# Patient Record
Sex: Male | Born: 1970 | Race: Black or African American | Hispanic: No | Smoking: Never smoker
Health system: Southern US, Community
[De-identification: ages and names within clinical notes are randomized; demographics above are authoritative.]

## PROBLEM LIST (undated history)

## (undated) DIAGNOSIS — J189 Pneumonia, unspecified organism: Secondary | ICD-10-CM

## (undated) DIAGNOSIS — Z789 Other specified health status: Secondary | ICD-10-CM

## (undated) HISTORY — PX: NO PAST SURGERIES: SHX2092

---

## 2011-10-04 ENCOUNTER — Encounter (INDEPENDENT_AMBULATORY_CARE_PROVIDER_SITE_OTHER): Payer: Self-pay | Admitting: General Surgery

## 2011-10-05 ENCOUNTER — Ambulatory Visit (INDEPENDENT_AMBULATORY_CARE_PROVIDER_SITE_OTHER): Payer: BC Managed Care – PPO | Admitting: General Surgery

## 2011-10-05 ENCOUNTER — Encounter (INDEPENDENT_AMBULATORY_CARE_PROVIDER_SITE_OTHER): Payer: Self-pay | Admitting: General Surgery

## 2011-10-05 VITALS — BP 132/82 | HR 68 | Temp 97.1°F | Resp 16 | Ht 68.0 in | Wt 233.5 lb

## 2011-10-05 DIAGNOSIS — D171 Benign lipomatous neoplasm of skin and subcutaneous tissue of trunk: Secondary | ICD-10-CM

## 2011-10-05 DIAGNOSIS — D1779 Benign lipomatous neoplasm of other sites: Secondary | ICD-10-CM

## 2011-10-05 NOTE — Progress Notes (Signed)
Patient ID: Noah Roach, male   DOB: January 10, 1971, 40 y.o.   MRN: [id number]  Chief Complaint  Patient presents with  . New Evaluation    eval of lipoma on upper back     HPI Noah Roach is a 40 y.o. male.  Referred by Dr.  HPI This is a 40 year old male who is otherwise healthy presents with a several year history of enlarging back mass. This area is otherwise asymptomatic. He has no history of infection or any other complaints referable to this. It has gotten larger over this time. He would like to have this area excised now.  History reviewed. No pertinent past medical history.  History reviewed. No pertinent past surgical history.  History reviewed. No pertinent family history.  Social History History  Substance Use Topics  . Smoking status: Never Smoker   . Smokeless tobacco: Never Used  . Alcohol Use: No    No Known Allergies  No current outpatient prescriptions on file.    Review of Systems Review of Systems  Constitutional: Negative for fever, chills and unexpected weight change.  HENT: Negative for hearing loss, congestion, sore throat, trouble swallowing and voice change.   Eyes: Negative for visual disturbance.  Respiratory: Negative for cough and wheezing.   Cardiovascular: Negative for chest pain, palpitations and leg swelling.  Gastrointestinal: Negative for nausea, vomiting, abdominal pain, diarrhea, constipation, blood in stool, abdominal distention, anal bleeding and rectal pain.  Genitourinary: Negative for hematuria and difficulty urinating.  Musculoskeletal: Negative for arthralgias.  Skin: Negative for rash and wound.  Neurological: Negative for seizures, syncope, weakness and headaches.  Hematological: Negative for adenopathy. Does not bruise/bleed easily.  Psychiatric/Behavioral: Negative for confusion.    Blood pressure 132/82, pulse 68, temperature 97.1 F (36.2 C), temperature source Temporal, resp. rate 16, height 5\' 8"  (1.727 m),  weight 233 lb 8 oz (105.915 kg).  Physical Exam Physical Exam  Constitutional: He appears well-developed and well-nourished.  Cardiovascular: Normal rate, regular rhythm and normal heart sounds.   Pulmonary/Chest: Effort normal and breath sounds normal. No respiratory distress. He has no wheezes. He has no rales.        Assessment    Back mass    Plan   I told him I did think this was a lipoma. We discussed observation but I do think is large enough now that it probably should be excised. We discussed an excision of this area with possible drain placement postoperatively. We discussed the risks of bleeding and  infection. He would like to schedule this soon.        Danelly Hassinger 10/05/2011, 4:06 PM

## 2011-11-02 ENCOUNTER — Encounter (HOSPITAL_BASED_OUTPATIENT_CLINIC_OR_DEPARTMENT_OTHER): Payer: Self-pay | Admitting: *Deleted

## 2011-11-02 NOTE — Progress Notes (Signed)
No meds No surg In [place]-will come in 2 hr early dos for labs

## 2011-11-06 ENCOUNTER — Encounter (HOSPITAL_BASED_OUTPATIENT_CLINIC_OR_DEPARTMENT_OTHER): Payer: Self-pay | Admitting: Anesthesiology

## 2011-11-06 ENCOUNTER — Ambulatory Visit (HOSPITAL_BASED_OUTPATIENT_CLINIC_OR_DEPARTMENT_OTHER): Payer: BC Managed Care – PPO | Admitting: Anesthesiology

## 2011-11-06 ENCOUNTER — Ambulatory Visit (HOSPITAL_BASED_OUTPATIENT_CLINIC_OR_DEPARTMENT_OTHER)
Admission: RE | Admit: 2011-11-06 | Discharge: 2011-11-06 | Disposition: A | Discharge status: Home / Self Care | Payer: BC Managed Care – PPO | Source: Ambulatory Visit | Attending: General Surgery | Admitting: General Surgery

## 2011-11-06 ENCOUNTER — Other Ambulatory Visit (INDEPENDENT_AMBULATORY_CARE_PROVIDER_SITE_OTHER): Payer: Self-pay | Admitting: General Surgery

## 2011-11-06 ENCOUNTER — Encounter (HOSPITAL_BASED_OUTPATIENT_CLINIC_OR_DEPARTMENT_OTHER)
Admission: RE | Disposition: A | Discharge status: Home / Self Care | Payer: Self-pay | Source: Ambulatory Visit | Attending: General Surgery

## 2011-11-06 ENCOUNTER — Encounter (HOSPITAL_BASED_OUTPATIENT_CLINIC_OR_DEPARTMENT_OTHER): Payer: Self-pay

## 2011-11-06 DIAGNOSIS — D1739 Benign lipomatous neoplasm of skin and subcutaneous tissue of other sites: Secondary | ICD-10-CM

## 2011-11-06 DIAGNOSIS — D1779 Benign lipomatous neoplasm of other sites: Secondary | ICD-10-CM | POA: Insufficient documentation

## 2011-11-06 HISTORY — PX: LIPOMA EXCISION: SHX5283

## 2011-11-06 HISTORY — DX: Other specified health status: Z78.9

## 2011-11-06 SURGERY — EXCISION LIPOMA
Anesthesia: General | Site: Back | Wound class: Clean

## 2011-11-06 MED ORDER — FENTANYL CITRATE 0.05 MG/ML IJ SOLN
INTRAMUSCULAR | Status: DC | PRN
  Administered 2011-11-06: 100 ug via INTRAVENOUS

## 2011-11-06 MED ORDER — FENTANYL CITRATE 0.05 MG/ML IJ SOLN: 25.0000 ug | INTRAMUSCULAR | Status: DC | PRN

## 2011-11-06 MED ORDER — DEXAMETHASONE SODIUM PHOSPHATE 4 MG/ML IJ SOLN
INTRAMUSCULAR | Status: DC | PRN
  Administered 2011-11-06: 10 mg via INTRAVENOUS

## 2011-11-06 MED ORDER — PROPOFOL 10 MG/ML IV EMUL
INTRAVENOUS | Status: DC | PRN
  Administered 2011-11-06: 300 mg via INTRAVENOUS

## 2011-11-06 MED ORDER — LIDOCAINE HCL (CARDIAC) 20 MG/ML IV SOLN
INTRAVENOUS | Status: DC | PRN
  Administered 2011-11-06: 100 mg via INTRAVENOUS

## 2011-11-06 MED ORDER — MIDAZOLAM HCL 5 MG/5ML IJ SOLN
INTRAMUSCULAR | Status: DC | PRN
  Administered 2011-11-06: 2 mg via INTRAVENOUS

## 2011-11-06 MED ORDER — OXYCODONE-ACETAMINOPHEN 5-325 MG PO TABS: 1.0000 | ORAL_TABLET | ORAL | Status: DC | PRN

## 2011-11-06 MED ORDER — MORPHINE SULFATE 2 MG/ML IJ SOLN: 0.0500 mg/kg | INTRAMUSCULAR | Status: DC | PRN

## 2011-11-06 MED ORDER — MIDAZOLAM HCL 2 MG/2ML IJ SOLN: 0.5000 mg | INTRAMUSCULAR | Status: DC | PRN

## 2011-11-06 MED ORDER — DROPERIDOL 2.5 MG/ML IJ SOLN
INTRAMUSCULAR | Status: DC | PRN
  Administered 2011-11-06: 0.625 mg via INTRAVENOUS

## 2011-11-06 MED ORDER — LACTATED RINGERS IV SOLN
INTRAVENOUS | Status: DC
  Administered 2011-11-06 (×2): via INTRAVENOUS

## 2011-11-06 MED ORDER — METOCLOPRAMIDE HCL 5 MG/ML IJ SOLN: 10.0000 mg | Freq: Once | INTRAMUSCULAR | Status: DC | PRN

## 2011-11-06 MED ORDER — SUCCINYLCHOLINE CHLORIDE 20 MG/ML IJ SOLN
INTRAMUSCULAR | Status: DC | PRN
  Administered 2011-11-06: 160 mg via INTRAVENOUS

## 2011-11-06 MED ORDER — ACETAMINOPHEN 10 MG/ML IV SOLN
1000.0000 mg | Freq: Once | INTRAVENOUS | Status: AC
  Administered 2011-11-06: 1000 mg via INTRAVENOUS

## 2011-11-06 MED ORDER — CEFAZOLIN SODIUM-DEXTROSE 2-3 GM-% IV SOLR
2.0000 g | INTRAVENOUS | Status: AC
  Administered 2011-11-06: 2 g via INTRAVENOUS

## 2011-11-06 SURGICAL SUPPLY — 50 items
106365 IMPLANT
BLADE SURG 15 STRL LF DISP TIS (BLADE) ×1 IMPLANT
BLADE SURG 15 STRL SS (BLADE) ×1
BLADE SURG ROTATE 9660 (MISCELLANEOUS) IMPLANT
CANISTER SUCTION 1200CC (MISCELLANEOUS) ×2 IMPLANT
CHLORAPREP W/TINT 26ML (MISCELLANEOUS) IMPLANT
CLOTH BEACON ORANGE TIMEOUT ST (SAFETY) ×2 IMPLANT
COVER MAYO STAND STRL (DRAPES) ×2 IMPLANT
COVER TABLE BACK 60X90 (DRAPES) ×2 IMPLANT
DECANTER SPIKE VIAL GLASS SM (MISCELLANEOUS) IMPLANT
DERMABOND ADVANCED (GAUZE/BANDAGES/DRESSINGS)
DERMABOND ADVANCED .7 DNX12 (GAUZE/BANDAGES/DRESSINGS) IMPLANT
DRAIN CHANNEL 19F RND (DRAIN) ×2 IMPLANT
DRAPE PED LAPAROTOMY (DRAPES) ×2 IMPLANT
DRSG TEGADERM 4X4.75 (GAUZE/BANDAGES/DRESSINGS) IMPLANT
ELECT COATED BLADE 2.86 ST (ELECTRODE) ×2 IMPLANT
ELECT REM PT RETURN 9FT ADLT (ELECTROSURGICAL) ×2
ELECTRODE REM PT RTRN 9FT ADLT (ELECTROSURGICAL) ×1 IMPLANT
EVACUATOR SILICONE 100CC (DRAIN) ×2 IMPLANT
GAUZE PACKING IODOFORM 1/4X5 (PACKING) IMPLANT
GAUZE SPONGE 4X4 12PLY STRL LF (GAUZE/BANDAGES/DRESSINGS) IMPLANT
GLOVE BIO SURGEON STRL SZ7 (GLOVE) ×2 IMPLANT
GLOVE BIOGEL PI IND STRL 7.5 (GLOVE) ×1 IMPLANT
GLOVE BIOGEL PI INDICATOR 7.5 (GLOVE) ×1
GLOVE SKINSENSE NS SZ6.5 (GLOVE) ×2
GLOVE SKINSENSE STRL SZ6.5 (GLOVE) ×2 IMPLANT
GOWN PREVENTION PLUS XLARGE (GOWN DISPOSABLE) ×4 IMPLANT
NEEDLE HYPO 25X1 1.5 SAFETY (NEEDLE) ×2 IMPLANT
NS IRRIG 1000ML POUR BTL (IV SOLUTION) ×2 IMPLANT
PACK BASIN DAY SURGERY FS (CUSTOM PROCEDURE TRAY) ×2 IMPLANT
PENCIL BUTTON HOLSTER BLD 10FT (ELECTRODE) ×2 IMPLANT
SPONGE GAUZE 4X4 12PLY (GAUZE/BANDAGES/DRESSINGS) ×2 IMPLANT
STAPLER VISISTAT 35W (STAPLE) ×2 IMPLANT
SUT ETHILON 2 0 FS 18 (SUTURE) ×2 IMPLANT
SUT MNCRL AB 4-0 PS2 18 (SUTURE) ×2 IMPLANT
SUT SILK 2 0 SH (SUTURE) IMPLANT
SUT VIC AB 2-0 SH 27 (SUTURE)
SUT VIC AB 2-0 SH 27XBRD (SUTURE) IMPLANT
SUT VICRYL 3-0 CR8 SH (SUTURE) ×2 IMPLANT
SUT VICRYL 4-0 PS2 18IN ABS (SUTURE) IMPLANT
SWAB CULTURE LIQ STUART DBL (MISCELLANEOUS) IMPLANT
SYR CONTROL 10ML LL (SYRINGE) ×2 IMPLANT
TAPE CLOTH SURG 4X10 WHT LF (GAUZE/BANDAGES/DRESSINGS) ×2 IMPLANT
TOWEL OR 17X24 6PK STRL BLUE (TOWEL DISPOSABLE) ×2 IMPLANT
TOWEL OR NON WOVEN STRL DISP B (DISPOSABLE) ×2 IMPLANT
TUBE ANAEROBIC SPECIMEN COL (MISCELLANEOUS) IMPLANT
TUBE CONNECTING 20X1/4 (TUBING) ×2 IMPLANT
UNDERPAD 30X30 INCONTINENT (UNDERPADS AND DIAPERS) IMPLANT
WATER STERILE IRR 1000ML POUR (IV SOLUTION) IMPLANT
YANKAUER SUCT BULB TIP NO VENT (SUCTIONS) ×2 IMPLANT

## 2011-11-06 NOTE — Anesthesia Preprocedure Evaluation (Signed)
Anesthesia Evaluation  Patient identified by MRN, date of birth, ID band Patient awake    Reviewed: Allergy & Precautions, H&P , NPO status , Patient's Chart, lab work & pertinent test results, reviewed documented beta blocker date and time   Airway Mallampati: II TM Distance: >3 FB Neck ROM: full    Dental   Pulmonary neg pulmonary ROS,          Cardiovascular neg cardio ROS     Neuro/Psych Negative Neurological ROS  Negative Psych ROS   GI/Hepatic negative GI ROS, Neg liver ROS,   Endo/Other  Negative Endocrine ROSMorbid obesity  Renal/GU negative Renal ROS  Genitourinary negative   Musculoskeletal   Abdominal   Peds  Hematology negative hematology ROS (+)   Anesthesia Other Findings See surgeon's H&P   Reproductive/Obstetrics negative OB ROS                           Anesthesia Physical Anesthesia Plan  ASA: III  Anesthesia Plan: General   Post-op Pain Management:    Induction: Intravenous  Airway Management Planned: Oral ETT  Additional Equipment:   Intra-op Plan:   Post-operative Plan: Extubation in OR  Informed Consent: I have reviewed the patients History and Physical, chart, labs and discussed the procedure including the risks, benefits and alternatives for the proposed anesthesia with the patient or authorized representative who has indicated his/her understanding and acceptance.     Plan Discussed with: CRNA and Surgeon  Anesthesia Plan Comments:         Anesthesia Quick Evaluation

## 2011-11-06 NOTE — Anesthesia Postprocedure Evaluation (Signed)
  Anesthesia Post-op Note  Patient: Noah Roach  Procedure(s) Performed:  EXCISION LIPOMA - Excision of back mass  Patient Location: PACU  Anesthesia Type: General  Level of Consciousness: awake  Airway and Oxygen Therapy: Patient Spontanous Breathing and Patient connected to face mask oxygen  Post-op Pain: none  Post-op Assessment: Post-op Vital signs reviewed, Patient's Cardiovascular Status Stable, Respiratory Function Stable and Patent Airway  Post-op Vital Signs: Reviewed and stable  Complications: No apparent anesthesia complications

## 2011-11-06 NOTE — H&P (Signed)
Noah Roach is an 41 y.o. male.   Chief Complaint: back mass HPI:  40yom with enlarging left upper back mass who would like this excised for symptoms.  I saw him a month ago and he has had no changes since then.  Past Medical History  Diagnosis Date  . No pertinent past medical history     Past Surgical History  Procedure Date  . No past surgeries     History reviewed. No pertinent family history. Social History:  reports that he has never smoked. He has never used smokeless tobacco. He reports that he does not drink alcohol or use illicit drugs.  Allergies: No Known Allergies  Medications Prior to Admission  Medication Dose Route Frequency Provider Last Rate Last Dose  . acetaminophen (OFIRMEV) IV 1,000 mg  1,000 mg Intravenous Once Constance Goltz, MD   1,000 mg at 11/06/11 1249  . ceFAZolin (ANCEF) IVPB 2 g/50 mL premix  2 g Intravenous 60 min Pre-Op Emelia Loron, MD      . lactated ringers infusion   Intravenous Continuous Constance Goltz, MD 20 mL/hr at 11/06/11 1239    . midazolam (VERSED) injection 0.5-2 mg  0.5-2 mg Intravenous PRN Constance Goltz, MD       No current outpatient prescriptions on file as of 11/06/2011.    No results found for this or any previous visit (from the past 48 hour(s)). No results found.  ROS  Blood pressure 138/97, pulse 74, temperature 98 F (36.7 C), temperature source Oral, resp. rate 20, height 5\' 8"  (1.727 m), weight 230 lb (104.327 kg), SpO2 99.00%. Physical Exam  Physical Exam  Physical Exam  Constitutional: He appears well-developed and well-nourished.  Cardiovascular: Normal rate, regular rhythm and normal heart sounds.  Pulmonary/Chest: Effort normal and breath sounds normal. No respiratory distress. He has no wheezes. He has no rales.     Assessment/Plan Back mass   We discussed observation but I do think is large enough now that it probably should be excised. We discussed an excision  of this area with possible drain placement postoperatively. We discussed the risks of bleeding and infection. He would like to schedule this soon.  Noah Roach 11/06/2011, 1:22 PM

## 2011-11-06 NOTE — Op Note (Signed)
Preoperative diagnosis: Back mass consistent with lipoma Postoperative diagnoses: Same as above  Procedure: Excision of subfascial 10 x 10 cm mass consistent with lipoma Surgeon: Dr. Harden Mo Anesthesia: Gen. Drains: 23 French Blake drain to subfascial space Estimated blood loss: Minimal Complications: None Specimens: Mass to pathology Sponge needle count correct x2 at end of operation Disposition the patient recovery room in stable condition  Indications: This is a 41 year old male with a back mass that has been rapidly enlarging lately. This clinically looked like a lipoma. He wanted this area excised we discussed doing.  Procedure: After informed consent was obtained the patient was taken to the operating room. He was initially 1 g of intravenous cefazolin. Sequential compression devices were placed on his legs prior to induction. He was then placed under general endotracheal anesthesia without complication. He was then rolled to the prone position appropriately padded. His back was prepped and draped in the standard sterile surgical fashion. A surgical timeout was performed.  I made a 6 cm incision overlying the mass. I used cautery to carry this below the fascia. What appeared to be a lipoma was then excised in total all the way down to his musculature. This was then passed off the table as a specimen. Hemostasis was obtained. Irrigation was performed. I inserted a 20 [place] Blake drain into the space due to the size of the space. I then closed this in several layers of 3-0 Vicryl and stapled the skin. Bacitracin and a sterile dressing was placed. He tolerated this well was extubated and transferred to the recovery room in stable condition.

## 2011-11-06 NOTE — Anesthesia Procedure Notes (Signed)
Procedure Name: Intubation Date/Time: 11/06/2011 1:44 PM Performed by: Signa Kell Pre-anesthesia Checklist: Patient identified, Emergency Drugs available, Suction available and Patient being monitored Patient Re-evaluated:Patient Re-evaluated prior to inductionOxygen Delivery Method: Circle System Utilized Preoxygenation: Pre-oxygenation with 100% oxygen Intubation Type: IV induction Ventilation: Mask ventilation without difficulty Laryngoscope Size: Mac and 3 Tube type: Oral Number of attempts: 1 Airway Equipment and Method: stylet Placement Confirmation: ETT inserted through vocal cords under direct vision,  positive ETCO2 and breath sounds checked- equal and bilateral Secured at: 21 cm Tube secured with: Tape Dental Injury: Teeth and Oropharynx as per pre-operative assessment

## 2011-11-06 NOTE — Transfer of Care (Signed)
Immediate Anesthesia Transfer of Care Note  Patient: Noah Roach  Procedure(s) Performed:  EXCISION LIPOMA - Excision of back mass  Patient Location: PACU  Anesthesia Type: General  Level of Consciousness: awake and alert   Airway & Oxygen Therapy: Patient Spontanous Breathing and Patient connected to face mask oxygen  Post-op Assessment: Report given to PACU RN and Post -op Vital signs reviewed and stable  Post vital signs: Reviewed and stable Filed Vitals:   11/06/11 1235  BP: 138/97  Pulse: 74  Temp: 36.7 C  Resp: 20    Complications: No apparent anesthesia complications

## 2011-11-06 NOTE — Interval H&P Note (Signed)
History and Physical Interval Note:  11/06/2011 1:23 PM  Noah Roach  has presented today for surgery, with the diagnosis of back mass  The various methods of treatment have been discussed with the patient and family. After consideration of risks, benefits and other options for treatment, the patient has consented to  Procedure(s): EXCISION LIPOMA as a surgical intervention .  The patients' history has been reviewed, patient examined, no change in status, stable for surgery.  I have reviewed the patients' chart and labs.  Questions were answered to the patient's satisfaction.     Noah Roach

## 2011-11-07 ENCOUNTER — Encounter (HOSPITAL_BASED_OUTPATIENT_CLINIC_OR_DEPARTMENT_OTHER): Payer: Self-pay | Admitting: General Surgery

## 2011-11-08 ENCOUNTER — Telehealth (INDEPENDENT_AMBULATORY_CARE_PROVIDER_SITE_OTHER): Payer: Self-pay

## 2011-11-08 NOTE — Telephone Encounter (Signed)
LMOM notifying him of his path report and telling him his f/u appt with Dr Dwain Sarna on 11-14-11 @8 :45.

## 2011-11-13 ENCOUNTER — Telehealth (INDEPENDENT_AMBULATORY_CARE_PROVIDER_SITE_OTHER): Payer: Self-pay

## 2011-11-13 NOTE — Telephone Encounter (Signed)
Patient called in saying he thought his back incision was swollen and draining. He had a dressing over it but could not see it. I asked him what kind drainage was coming from the area and he couldn't tell me. He also could not tell me if it was red, hot to touch, or exactly if it was swollen or not. He said he could not take dressing off to look at area because of its location. No one is at home with him to help. He said someone will be there in a little while and he would call back.

## 2011-11-14 ENCOUNTER — Ambulatory Visit (INDEPENDENT_AMBULATORY_CARE_PROVIDER_SITE_OTHER): Payer: BC Managed Care – PPO | Admitting: General Surgery

## 2011-11-14 ENCOUNTER — Encounter (INDEPENDENT_AMBULATORY_CARE_PROVIDER_SITE_OTHER): Payer: Self-pay | Admitting: General Surgery

## 2011-11-14 VITALS — BP 122/86 | HR 75 | Temp 97.8°F | Ht 68.0 in | Wt 228.2 lb

## 2011-11-14 DIAGNOSIS — Z09 Encounter for follow-up examination after completed treatment for conditions other than malignant neoplasm: Secondary | ICD-10-CM

## 2011-11-14 MED ORDER — OXYCODONE-ACETAMINOPHEN 5-325 MG PO TABS: 1.0000 | ORAL_TABLET | ORAL | Status: AC | PRN

## 2011-11-14 NOTE — Patient Instructions (Signed)
Continue recording drain output.

## 2011-11-14 NOTE — Progress Notes (Signed)
Subjective:     Patient ID: Noah Roach, male   DOB: [birth year], 41 y.o.   MRN: [id number]  HPI This is a 41 year old male who is one week status post a very large upper back lipoma excision. I left a drain and at that time. His pathology showed a lipoma. He had some trouble with some bleeding around his drain and it sounds like his drain was clogged. He comes in today also complains of some swelling at the surgical site and in his shoulder. He otherwise denies any fevers or any other complaints.  Review of Systems     Objective:   Physical Exam Incision without infection, moderate edema, drain with serosang fluid in it and is clogged    Assessment:     S/p lipoma excision    Plan:     Discussed why drainage was occurring and will continue drain for another week I will remove staples in 2 weeks I asked him to call if drain gets clogged again and will have him return sooner

## 2011-11-15 ENCOUNTER — Telehealth (INDEPENDENT_AMBULATORY_CARE_PROVIDER_SITE_OTHER): Payer: Self-pay | Admitting: General Surgery

## 2011-11-15 ENCOUNTER — Ambulatory Visit (INDEPENDENT_AMBULATORY_CARE_PROVIDER_SITE_OTHER): Payer: Self-pay

## 2011-11-15 DIAGNOSIS — Z4800 Encounter for change or removal of nonsurgical wound dressing: Secondary | ICD-10-CM

## 2011-11-15 NOTE — Telephone Encounter (Signed)
Patient contacted the office regarding his drain tube,gave him the instruction on how to milk the tube, pt unable to get any drainage, pt was told to come in today to see a nurse for instuction.

## 2011-11-15 NOTE — Progress Notes (Signed)
Pt came in for nurse only to have his drain checked from excision of lipoma on back b/c he felt the drain was not working. I looked at the bulb which had less than 5cc's in the bulb and I milked the drain tube to see if anymore drainage was stuck in the tube. I was able to get less than 2cc's down the drain tube into the bulb. I explained that the drain is still in place and seems to be working fine. I think the pt is just getting less in the bulb now and he thinks something is wrong with drain. I recovered the drain and the incision with the staples. I advised pt to call me if he has anymore changes. The pt has f/u appt with Dr Dwain Sarna.

## 2011-11-20 ENCOUNTER — Encounter (INDEPENDENT_AMBULATORY_CARE_PROVIDER_SITE_OTHER): Payer: Self-pay | Admitting: General Surgery

## 2011-11-20 ENCOUNTER — Ambulatory Visit (INDEPENDENT_AMBULATORY_CARE_PROVIDER_SITE_OTHER): Payer: Self-pay | Admitting: General Surgery

## 2011-11-20 VITALS — BP 132/84 | HR 85 | Temp 98.1°F | Ht 68.0 in | Wt 228.6 lb

## 2011-11-20 DIAGNOSIS — Z09 Encounter for follow-up examination after completed treatment for conditions other than malignant neoplasm: Secondary | ICD-10-CM

## 2011-11-20 NOTE — Progress Notes (Signed)
Subjective:     Patient ID: Noah Roach, male   DOB: 09/21/1971, 41 y.o.   MRN: [id number]  HPI This is a 41 year old male I took a very large upper back lipoma off. He's had a drain in postoperatively. He had a hematoma which is now resolving. His drain is not really putting out anything at all. He has no real complaints today. He comes in today to discuss drain and staple removal.  Review of Systems     Objective:   Physical Exam Healing incision without infection, staples and drain in place    Assessment:     Excision of large back lipoma    Plan:     I removed his drain and his staples today Return to work Monday Return to see me as needed

## 2016-06-22 ENCOUNTER — Emergency Department (HOSPITAL_COMMUNITY): Payer: Self-pay

## 2016-06-22 ENCOUNTER — Encounter (HOSPITAL_COMMUNITY): Payer: Self-pay

## 2016-06-22 ENCOUNTER — Emergency Department (HOSPITAL_COMMUNITY)
Admission: EM | Admit: 2016-06-22 | Discharge: 2016-06-22 | Disposition: A | Discharge status: Home / Self Care | Payer: Self-pay | Attending: Emergency Medicine | Admitting: Emergency Medicine

## 2016-06-22 DIAGNOSIS — J189 Pneumonia, unspecified organism: Secondary | ICD-10-CM | POA: Insufficient documentation

## 2016-06-22 LAB — BASIC METABOLIC PANEL
ANION GAP: 10 (ref 5–15)
BUN: 10 mg/dL (ref 6–20)
CHLORIDE: 106 mmol/L (ref 101–111)
CO2: 21 mmol/L — ABNORMAL LOW (ref 22–32)
Calcium: 9.1 mg/dL (ref 8.9–10.3)
Creatinine, Ser: 1.08 mg/dL (ref 0.61–1.24)
GFR calc Af Amer: >60 mL/min (ref >60)
Glucose, Bld: 104 mg/dL — ABNORMAL HIGH (ref 65–99)
POTASSIUM: 3.8 mmol/L (ref 3.5–5.1)
SODIUM: 137 mmol/L (ref 135–145)

## 2016-06-22 LAB — CBC WITH DIFFERENTIAL/PLATELET
BASOS ABS: 0 10*3/uL (ref 0.0–0.1)
Basophils Relative: 0 %
EOS ABS: 0.2 10*3/uL (ref 0.0–0.7)
EOS PCT: 3 %
HCT: 41.5 % (ref 39.0–52.0)
HEMOGLOBIN: 13.3 g/dL (ref 13.0–17.0)
LYMPHS ABS: 1.3 10*3/uL (ref 0.7–4.0)
LYMPHS PCT: 23 %
MCH: 26.7 pg (ref 26.0–34.0)
MCHC: 32 g/dL (ref 30.0–36.0)
MCV: 83.2 fL (ref 78.0–100.0)
Monocytes Absolute: 1 10*3/uL (ref 0.1–1.0)
Monocytes Relative: 18 %
NEUTROS PCT: 56 %
Neutro Abs: 3.2 10*3/uL (ref 1.7–7.7)
PLATELETS: 215 10*3/uL (ref 150–400)
RBC: 4.99 MIL/uL (ref 4.22–5.81)
RDW: 13.8 % (ref 11.5–15.5)
WBC: 5.6 10*3/uL (ref 4.0–10.5)

## 2016-06-22 MED ORDER — LEVOFLOXACIN 750 MG PO TABS
750.0000 mg | ORAL_TABLET | Freq: Every day | ORAL | Status: DC
  Administered 2016-06-22: 750 mg via ORAL
  Filled 2016-06-22: qty 1

## 2016-06-22 MED ORDER — LEVOFLOXACIN 750 MG PO TABS: 750.0000 mg | ORAL_TABLET | Freq: Every day | ORAL | 0 refills | Status: DC

## 2016-06-22 NOTE — ED Notes (Signed)
This RN ambulated pt around the unit. The pt O2 sat stayed above 90% on RA and the pt HR remained around 95 bpm. Merry Proud, PA notified.

## 2016-06-22 NOTE — ED Notes (Signed)
Patient transported to X-ray 

## 2016-06-22 NOTE — Discharge Instructions (Signed)
Please take antibiotics as directed, please follow-up with infectious disease specialist for reevaluation further management. Please return the emergency room immediately if you experience any new or worsening signs or symptoms

## 2016-06-22 NOTE — ED Provider Notes (Signed)
[facility] DEPT Provider Note   CSN: [id number] Arrival date & time: 06/22/16  1145     History   Chief Complaint Chief Complaint  Patient presents with  . Shortness of Breath  . Back Pain    HPI Noah Roach is a 45 y.o. male.  HPI   64 show male presents today with complaints of cough and shortness of breath. Patient reports that at the end of July he was [place], and noted that he started having upper respiratory complaints including cough while on his way there. He reports that the cough continue persist, and followed up at a clinic. He notes that chest x-ray was done which showed an infection in his lungs. He was placed on antibiotics, and notes that symptoms improved. He states that on Tuesday after cleaning his bathroom he started to have a cough again, nonproductive with associated back pain. He reports that he feels extremely fatigued, and ambulation causes shortness of breath. He denies any chest pain, history of cardiac problems, history DVT or PE, y pulmonary history, lower extremity swelling or edema, or any PE DVT risk factors. Patient notes she's been using Mucinex at home with no relief the symptoms. Vision is a nonsmoker.  Past Medical History:  Diagnosis Date  . No pertinent past medical history     There are no active problems to display for this patient.   Past Surgical History:  Procedure Laterality Date  . LIPOMA EXCISION  11/06/2011   Procedure: EXCISION LIPOMA;  Surgeon: Rolm Bookbinder, MD;  Location: [facility];  Service: General;  Laterality: N/A;  Excision of back mass  . NO PAST SURGERIES        Home Medications    Prior to Admission medications   Medication Sig Start Date End Date Taking? Authorizing Provider  levofloxacin (LEVAQUIN) 750 MG tablet Take 1 tablet (750 mg total) by mouth daily. 06/22/16   Okey Regal, PA-C    Family History No family history on file.  Social History Social History    Substance Use Topics  . Smoking status: Never Smoker  . Smokeless tobacco: Never Used  . Alcohol use No     Allergies   Review of patient's allergies indicates no known allergies.   Review of Systems Review of Systems  All other systems reviewed and are negative.    Physical Exam Updated Vital Signs BP 136/99   Pulse 85   Temp 98.3 F (36.8 C) (Oral)   Resp 18   Ht 5\' 8"  (1.727 m)   Wt 106.1 kg   SpO2 96%   BMI 35.58 kg/m   Physical Exam  Constitutional: He is oriented to person, place, and time. He appears well-developed and well-nourished.  HENT:  Head: Normocephalic and atraumatic.  Eyes: Conjunctivae are normal. Pupils are equal, round, and reactive to light. Right eye exhibits no discharge. Left eye exhibits no discharge. No scleral icterus.  Neck: Normal range of motion. No JVD present. No tracheal deviation present.  Pulmonary/Chest: Effort normal. No stridor.  Crackles right lower lobe  Musculoskeletal: He exhibits no edema.  Neurological: He is alert and oriented to person, place, and time. Coordination normal.  Psychiatric: He has a normal mood and affect. His behavior is normal. Judgment and thought content normal.  Nursing note and vitals reviewed.    ED Treatments / Results  Labs (all labs ordered are listed, but only abnormal results are displayed) Labs Reviewed  BASIC METABOLIC PANEL - Abnormal; Notable for  the following:       Result Value   CO2 21 (*)    Glucose, Bld 104 (*)    All other components within normal limits  CBC WITH DIFFERENTIAL/PLATELET    EKG  EKG Interpretation  Date/Time:  Thursday June 22 2016 11:50:31 EDT Ventricular Rate:  103 PR Interval:  144 QRS Duration: 76 QT Interval:  336 QTC Calculation: 440 R Axis:   49 Text Interpretation:  Sinus tachycardia Otherwise normal ECG No old tracing to compare Confirmed by FLOYD MD, DANIEL [phone]) on 06/22/2016 12:29:33 PM       Radiology Dg Chest 2 View  Result  Date: 06/22/2016 CLINICAL DATA:  Cough and sob since July 22 was overseas and got worse in [place] was checked and giving meds and got even worse when he got back to the states On the 21st Of august EXAM: CHEST  2 VIEW COMPARISON:  None. FINDINGS: There is hazy bilateral airspace opacity in the lower lungs, left greater than right, partly silhouetting the posterior hemidiaphragms. Focal opacities noted along the minor fissure. There is hazy peripheral opacity in the left upper lobe with asymmetric pleural thickening at the left apex. There is no vascular congestion interstitial thickening to suggest pulmonary edema. No pleural effusion or pneumothorax. Cardiac silhouette is normal in size. No mediastinal or hilar masses or convincing adenopathy. Skeletal structures are unremarkable. IMPRESSION: 1. Findings are consistent with multifocal pneumonia. Recommend followup radiographs after treatment to document improvement/resolution. Electronically Signed   By: Lajean Manes M.D.   On: 06/22/2016 12:58    Procedures Procedures (including critical care time)  Medications Ordered in ED Medications  levofloxacin (LEVAQUIN) tablet 750 mg (750 mg Oral Given 06/22/16 1359)     Initial Impression / Assessment and Plan / ED Course  I have reviewed the triage vital signs and the nursing notes.  Pertinent labs & imaging results that were available during my care of the patient were reviewed by me and considered in my medical decision making (see chart for details).  Clinical Course    Final Clinical Impressions(s) / ED Diagnoses   Final diagnoses:  Community acquired pneumonia   Labs:CBC, BMP  Imaging: DG chest  Consults:  Therapeutics:  Discharge Meds:   Assessment/Plan:  71 show male presents today with multifocal pneumonia. Patient is afebrile nontoxic in no acute distress. Patient was ambulated here in the ED with no signs of low oxygen saturation. Due to patient's recurrence and multifocal  pneumonia infectious disease was consult. I spoke with Dr. Megan Salon who reports that patient will likely benefit from outpatient management with respiratory fluoroquinolone, and at this point no acute concern for atypical infection other than immediate acquired pneumonia. He offered to see the patient in the infectious disease clinic as patient does not have a primary care provider. Patient was in agreement today's plan, should assault evaluation, strict return precautions given. Patient had no further questions or concerns the time discharge    New Prescriptions Discharge Medication List as of 06/22/2016  1:58 PM    START taking these medications   Details  levofloxacin (LEVAQUIN) 750 MG tablet Take 1 tablet (750 mg total) by mouth daily., Starting Thu 06/22/2016, Print         Okey Regal, PA-C 06/22/16 [place], DO 06/23/16 [phone]

## 2016-06-22 NOTE — ED Notes (Signed)
Pt states he returned to the [place] Monday after having a 4 week trip where he visited [place], [place] and [place]. Pt states he started developing a cough otw to [place] while on the plane. Pt states that while in [place] he had a negative cxr and was given a medication that took away his cough. Pt states that upon return to the [place] and while cleaning his bathroom his cough returned. Pt in NAD.

## 2016-06-22 NOTE — ED Triage Notes (Signed)
Patient complains of shortness of breath and cough with pain to back with inspiration since late July. Seen by MD and had negative Tb test and cxr. States that the shortness of breath and cough started again this past Sunday after cleaning bathroom. NAD

## 2016-07-24 ENCOUNTER — Other Ambulatory Visit: Payer: Self-pay | Admitting: Nurse Practitioner

## 2016-07-24 ENCOUNTER — Ambulatory Visit
Admission: RE | Admit: 2016-07-24 | Discharge: 2016-07-24 | Disposition: A | Discharge status: Home / Self Care | Payer: No Typology Code available for payment source | Source: Ambulatory Visit | Attending: Nurse Practitioner | Admitting: Nurse Practitioner

## 2016-07-24 DIAGNOSIS — R0602 Shortness of breath: Secondary | ICD-10-CM

## 2016-08-09 ENCOUNTER — Inpatient Hospital Stay (HOSPITAL_COMMUNITY)
Admission: EM | Admit: 2016-08-09 | Discharge: 2016-08-15 | DRG: 168 | Disposition: A | Discharge status: Home / Self Care | Payer: PRIVATE HEALTH INSURANCE | Attending: Internal Medicine | Admitting: Internal Medicine

## 2016-08-09 ENCOUNTER — Encounter (HOSPITAL_COMMUNITY): Payer: Self-pay | Admitting: Emergency Medicine

## 2016-08-09 ENCOUNTER — Emergency Department (HOSPITAL_COMMUNITY): Payer: PRIVATE HEALTH INSURANCE

## 2016-08-09 DIAGNOSIS — R918 Other nonspecific abnormal finding of lung field: Secondary | ICD-10-CM

## 2016-08-09 DIAGNOSIS — J189 Pneumonia, unspecified organism: Secondary | ICD-10-CM | POA: Diagnosis not present

## 2016-08-09 DIAGNOSIS — R05 Cough: Secondary | ICD-10-CM | POA: Diagnosis present

## 2016-08-09 DIAGNOSIS — Z23 Encounter for immunization: Secondary | ICD-10-CM

## 2016-08-09 DIAGNOSIS — S025XXA Fracture of tooth (traumatic), initial encounter for closed fracture: Secondary | ICD-10-CM | POA: Diagnosis present

## 2016-08-09 DIAGNOSIS — Z9889 Other specified postprocedural states: Secondary | ICD-10-CM

## 2016-08-09 DIAGNOSIS — R059 Cough, unspecified: Secondary | ICD-10-CM | POA: Diagnosis present

## 2016-08-09 DIAGNOSIS — W19XXXA Unspecified fall, initial encounter: Secondary | ICD-10-CM | POA: Diagnosis present

## 2016-08-09 DIAGNOSIS — R55 Syncope and collapse: Secondary | ICD-10-CM | POA: Diagnosis present

## 2016-08-09 DIAGNOSIS — B348 Other viral infections of unspecified site: Secondary | ICD-10-CM | POA: Diagnosis present

## 2016-08-09 HISTORY — DX: Pneumonia, unspecified organism: J18.9

## 2016-08-09 LAB — URINALYSIS, ROUTINE W REFLEX MICROSCOPIC
Bilirubin Urine: NEGATIVE
Glucose, UA: NEGATIVE mg/dL
HGB URINE DIPSTICK: NEGATIVE
Ketones, ur: 15 mg/dL — AB
Leukocytes, UA: NEGATIVE
NITRITE: NEGATIVE
PH: 6.5 (ref 5.0–8.0)
Protein, ur: NEGATIVE mg/dL
SPECIFIC GRAVITY, URINE: 1.023 (ref 1.005–1.030)

## 2016-08-09 LAB — CBC WITH DIFFERENTIAL/PLATELET
BASOS ABS: 0 10*3/uL (ref 0.0–0.1)
BASOS PCT: 1 %
EOS ABS: 0.3 10*3/uL (ref 0.0–0.7)
EOS PCT: 5 %
HCT: 39.2 % (ref 39.0–52.0)
Hemoglobin: 12.7 g/dL — ABNORMAL LOW (ref 13.0–17.0)
Lymphocytes Relative: 23 %
Lymphs Abs: 1.4 10*3/uL (ref 0.7–4.0)
MCH: 25.7 pg — ABNORMAL LOW (ref 26.0–34.0)
MCHC: 32.4 g/dL (ref 30.0–36.0)
MCV: 79.4 fL (ref 78.0–100.0)
MONO ABS: 0.9 10*3/uL (ref 0.1–1.0)
Monocytes Relative: 14 %
Neutro Abs: 3.5 10*3/uL (ref 1.7–7.7)
Neutrophils Relative %: 57 %
PLATELETS: 381 10*3/uL (ref 150–400)
RBC: 4.94 MIL/uL (ref 4.22–5.81)
RDW: 13.5 % (ref 11.5–15.5)
WBC: 6.1 10*3/uL (ref 4.0–10.5)

## 2016-08-09 LAB — COMPREHENSIVE METABOLIC PANEL
ALBUMIN: 3.3 g/dL — AB (ref 3.5–5.0)
ALT: 21 U/L (ref 17–63)
AST: 24 U/L (ref 15–41)
Alkaline Phosphatase: 65 U/L (ref 38–126)
Anion gap: 11 (ref 5–15)
BUN: 13 mg/dL (ref 6–20)
CHLORIDE: 102 mmol/L (ref 101–111)
CO2: 25 mmol/L (ref 22–32)
Calcium: 9.7 mg/dL (ref 8.9–10.3)
Creatinine, Ser: 1.12 mg/dL (ref 0.61–1.24)
GFR calc Af Amer: >60 mL/min (ref >60)
Glucose, Bld: 140 mg/dL — ABNORMAL HIGH (ref 65–99)
POTASSIUM: 3.7 mmol/L (ref 3.5–5.1)
SODIUM: 138 mmol/L (ref 135–145)
Total Bilirubin: 0.5 mg/dL (ref 0.3–1.2)
Total Protein: 7.5 g/dL (ref 6.5–8.1)

## 2016-08-09 MED ORDER — VANCOMYCIN HCL 10 G IV SOLR
1500.0000 mg | Freq: Once | INTRAVENOUS | Status: AC
  Administered 2016-08-10: 1500 mg via INTRAVENOUS
  Filled 2016-08-09: qty 1500

## 2016-08-09 MED ORDER — KETOROLAC TROMETHAMINE 30 MG/ML IJ SOLN
30.0000 mg | Freq: Once | INTRAMUSCULAR | Status: AC
Start: 2016-08-09 — End: 2016-08-10
  Administered 2016-08-10: 30 mg via INTRAVENOUS
  Filled 2016-08-09: qty 1

## 2016-08-09 MED ORDER — CEFEPIME HCL 2 G IJ SOLR
2.0000 g | Freq: Once | INTRAMUSCULAR | Status: AC
  Administered 2016-08-10: 2 g via INTRAVENOUS
  Filled 2016-08-09: qty 2

## 2016-08-09 NOTE — ED Triage Notes (Addendum)
Pt. reports brief syncopal episode this evening , pt. stated that he was coughing hard  and felt dizzy then fell forward hit his face against the ground , alert and oriented at triage , speech clear with no facial asymmetry , equal grips with no arm drift . Pt. added recently treated with antibiotic for pneumonia  , denies fever or chills.

## 2016-08-09 NOTE — ED Provider Notes (Signed)
[facility] DEPT Provider Note   CSN: [id number] Arrival date & time: 08/09/16  1928  By signing my name below, I, Evelene Croon, attest that this documentation has been prepared under the direction and in the presence of Everlene Balls, MD . Electronically Signed: Evelene Croon, Scribe. 08/09/2016. 11:54 PM.  History   Chief Complaint Chief Complaint  Patient presents with  . Syncope   The history is provided by the patient. No language interpreter was used.     HPI Comments:  Noah Roach is a 45 y.o. male who presents to the Emergency Department complaining of persistent dry cough x ~ 2-3 months. Pt reports associated SOB and CP. Pt states he was originally evaluated for this cough in July 2017 and was given meds which improved his symptoms. He then went on trip to [place] and after retuning his cough returned. He was also evaluated in the ED on 06/22/16 for cough and was diagnosed with PNA. He states he has taken 2 different antibiotics without improvement since his symptoms returned (one of which was levaquin, unsure of the other). Pt also reports syncopal episode ~1900 today after coughing episode; states he felt dizzy and then fell to the ground. Pt states he broke his teeth with this fall. Pt denies fever.   Past Medical History:  Diagnosis Date  . No pertinent past medical history   . Pneumonia     There are no active problems to display for this patient.   Past Surgical History:  Procedure Laterality Date  . LIPOMA EXCISION  11/06/2011   Procedure: EXCISION LIPOMA;  Surgeon: Rolm Bookbinder, MD;  Location: [facility];  Service: General;  Laterality: N/A;  Excision of back mass  . NO PAST SURGERIES         Home Medications    Prior to Admission medications   Medication Sig Start Date End Date Taking? Authorizing Provider  levofloxacin (LEVAQUIN) 750 MG tablet Take 1 tablet (750 mg total) by mouth daily. Patient not taking: Reported on  08/09/2016 06/22/16   Okey Regal, PA-C    Family History No family history on file.  Social History Social History  Substance Use Topics  . Smoking status: Never Smoker  . Smokeless tobacco: Never Used  . Alcohol use No     Allergies   Review of patient's allergies indicates no known allergies.   Review of Systems Review of Systems 10 systems reviewed and all are negative for acute change except as noted in the HPI.   Physical Exam Updated Vital Signs BP 156/98 (BP Location: Left Arm)   Pulse 102   Temp 98.4 F (36.9 C) (Oral)   Resp 16   Ht 5\' 8"  (1.727 m)   Wt 224 lb (101.6 kg)   SpO2 99%   BMI 34.06 kg/m   Physical Exam  Constitutional: He is oriented to person, place, and time. Vital signs are normal. He appears well-developed and well-nourished.  Non-toxic appearance. He does not appear ill. No distress.  HENT:  Head: Normocephalic and atraumatic.  Nose: Nose normal.  Mouth/Throat: Oropharynx is clear and moist. No oropharyngeal exudate.  Tooth #8 and 9 are chipped, 8 is loose but still attached to the gums  Eyes: Conjunctivae and EOM are normal. Pupils are equal, round, and reactive to light. No scleral icterus.  Neck: Normal range of motion. Neck supple. No tracheal deviation, no edema, no erythema and normal range of motion present. No thyroid mass and no thyromegaly present.  Cardiovascular:  Normal rate, regular rhythm, S1 normal, S2 normal, normal heart sounds, intact distal pulses and normal pulses.  Exam reveals no gallop and no friction rub.   No murmur heard. Pulmonary/Chest: Effort normal. No respiratory distress. He has no wheezes. He has rhonchi.  Diffuses crackles  Abdominal: Soft. Normal appearance and bowel sounds are normal. He exhibits no distension, no ascites and no mass. There is no hepatosplenomegaly. There is no tenderness. There is no rebound, no guarding and no CVA tenderness.  Musculoskeletal: Normal range of motion. He exhibits no  edema or tenderness.  Lymphadenopathy:    He has no cervical adenopathy.  Neurological: He is alert and oriented to person, place, and time. He has normal strength. No cranial nerve deficit or sensory deficit.  Skin: Skin is warm, dry and intact. No petechiae and no rash noted. He is not diaphoretic. No erythema. No pallor.  Nursing note and vitals reviewed.    ED Treatments / Results  DIAGNOSTIC STUDIES:  Oxygen Saturation is 99% on RA, normal by my interpretation.    COORDINATION OF CARE:  11:20 PM Discussed treatment plan with pt at bedside and pt agreed to plan.  Labs (all labs ordered are listed, but only abnormal results are displayed) Labs Reviewed  CBC WITH DIFFERENTIAL/PLATELET - Abnormal; Notable for the following:       Result Value   Hemoglobin 12.7 (*)    MCH 25.7 (*)    All other components within normal limits  COMPREHENSIVE METABOLIC PANEL - Abnormal; Notable for the following:    Glucose, Bld 140 (*)    Albumin 3.3 (*)    All other components within normal limits  URINALYSIS, ROUTINE W REFLEX MICROSCOPIC (NOT AT [facility]) - Abnormal; Notable for the following:    Color, Urine AMBER (*)    Ketones, ur 15 (*)    All other components within normal limits  CULTURE, BLOOD (ROUTINE X 2)  CULTURE, BLOOD (ROUTINE X 2)    EKG  EKG Interpretation None       Radiology Dg Chest 2 View  Result Date: 08/09/2016 CLINICAL DATA:  Pneumonia last month, persistent cough and shortness of breath EXAM: CHEST  2 VIEW COMPARISON:  07/24/2016 FINDINGS: Normal heart size, mediastinal contours, and pulmonary vascularity. Persistent pulmonary infiltrates diffusely throughout LEFT lung and at mid to lower RIGHT lung. Probable component of atelectasis at minor fissure. No definite pleural effusion or pneumothorax. IMPRESSION: Persistent BILATERAL pulmonary infiltrates consistent with pneumonia, probably slightly increased on LEFT since previous exam. Electronically Signed   By: Lavonia Dana M.D.   On: 08/09/2016 20:59    Procedures Procedures (including critical care time)  Medications Ordered in ED Medications  vancomycin (VANCOCIN) 1,500 mg in sodium chloride 0.9 % 500 mL IVPB (not administered)  ceFEPIme (MAXIPIME) 2 g in dextrose 5 % 50 mL IVPB (not administered)  ketorolac (TORADOL) 30 MG/ML injection 30 mg (not administered)     Initial Impression / Assessment and Plan / ED Course  I have reviewed the triage vital signs and the nursing notes.  Pertinent labs & imaging results that were available during my care of the patient were reviewed by me and considered in my medical decision making (see chart for details).  Clinical Course    Patient presents to the ED for persistent SOB, cough and weakness.  He had a syncopal episode today and CXR reveals worsening pneumonia.  Patient will require admission for IV abx.  Will obtain CT scan for further evaluation of  this persistent pneumonia and CT of face for facial pain after the fall.  He was given vanc and cefepime.  Bcx drawn prior.  2:02 AM CT reveals a maxilla fracture and complex pneumonia vs intertesitial disease.  Dr. Porfirio Mylar accepts to tele.  Final Clinical Impressions(s) / ED Diagnoses   Final diagnoses:  Pneumonia    New Prescriptions New Prescriptions   No medications on file     I personally performed the services described in this documentation, which was scribed in my presence. The recorded information has been reviewed and is accurate.      Everlene Balls, MD 08/10/16 [phone]

## 2016-08-10 ENCOUNTER — Emergency Department (HOSPITAL_COMMUNITY): Payer: PRIVATE HEALTH INSURANCE

## 2016-08-10 ENCOUNTER — Encounter (HOSPITAL_COMMUNITY): Payer: Self-pay | Admitting: Radiology

## 2016-08-10 DIAGNOSIS — J189 Pneumonia, unspecified organism: Secondary | ICD-10-CM | POA: Diagnosis present

## 2016-08-10 DIAGNOSIS — R05 Cough: Secondary | ICD-10-CM | POA: Diagnosis not present

## 2016-08-10 DIAGNOSIS — R918 Other nonspecific abnormal finding of lung field: Secondary | ICD-10-CM

## 2016-08-10 DIAGNOSIS — R079 Chest pain, unspecified: Secondary | ICD-10-CM | POA: Diagnosis not present

## 2016-08-10 DIAGNOSIS — S025XXA Fracture of tooth (traumatic), initial encounter for closed fracture: Secondary | ICD-10-CM | POA: Diagnosis present

## 2016-08-10 DIAGNOSIS — R55 Syncope and collapse: Secondary | ICD-10-CM

## 2016-08-10 DIAGNOSIS — Z23 Encounter for immunization: Secondary | ICD-10-CM | POA: Diagnosis not present

## 2016-08-10 DIAGNOSIS — B348 Other viral infections of unspecified site: Secondary | ICD-10-CM | POA: Diagnosis present

## 2016-08-10 DIAGNOSIS — R059 Cough, unspecified: Secondary | ICD-10-CM | POA: Diagnosis present

## 2016-08-10 DIAGNOSIS — W19XXXA Unspecified fall, initial encounter: Secondary | ICD-10-CM | POA: Diagnosis present

## 2016-08-10 LAB — RESPIRATORY PANEL BY PCR
ADENOVIRUS-RVPPCR: NOT DETECTED
BORDETELLA PERTUSSIS-RVPCR: NOT DETECTED
CHLAMYDOPHILA PNEUMONIAE-RVPPCR: NOT DETECTED
Coronavirus 229E: NOT DETECTED
Coronavirus HKU1: NOT DETECTED
Coronavirus NL63: NOT DETECTED
Coronavirus OC43: NOT DETECTED
INFLUENZA A-RVPPCR: NOT DETECTED
INFLUENZA B-RVPPCR: NOT DETECTED
MYCOPLASMA PNEUMONIAE-RVPPCR: NOT DETECTED
Metapneumovirus: NOT DETECTED
PARAINFLUENZA VIRUS 3-RVPPCR: NOT DETECTED
PARAINFLUENZA VIRUS 4-RVPPCR: NOT DETECTED
Parainfluenza Virus 1: NOT DETECTED
Parainfluenza Virus 2: NOT DETECTED
RESPIRATORY SYNCYTIAL VIRUS-RVPPCR: NOT DETECTED
RHINOVIRUS / ENTEROVIRUS - RVPPCR: DETECTED — AB

## 2016-08-10 LAB — HIV ANTIBODY (ROUTINE TESTING W REFLEX): HIV SCREEN 4TH GENERATION: NONREACTIVE

## 2016-08-10 LAB — LACTIC ACID, PLASMA: Lactic Acid, Venous: 1.9 mmol/L (ref 0.5–1.9)

## 2016-08-10 LAB — PROTIME-INR
INR: 1.07
PROTHROMBIN TIME: 13.9 s (ref 11.4–15.2)

## 2016-08-10 LAB — STREP PNEUMONIAE URINARY ANTIGEN: STREP PNEUMO URINARY ANTIGEN: NEGATIVE

## 2016-08-10 MED ORDER — SODIUM CHLORIDE 0.9 % IV BOLUS (SEPSIS)
2000.0000 mL | Freq: Once | INTRAVENOUS | Status: AC
  Administered 2016-08-10: 2000 mL via INTRAVENOUS

## 2016-08-10 MED ORDER — ENOXAPARIN SODIUM 40 MG/0.4ML ~~LOC~~ SOLN
40.0000 mg | SUBCUTANEOUS | Status: DC
  Administered 2016-08-10: 40 mg via SUBCUTANEOUS
  Filled 2016-08-10: qty 0.4

## 2016-08-10 MED ORDER — ACETAMINOPHEN 325 MG PO TABS: 650.0000 mg | ORAL_TABLET | Freq: Four times a day (QID) | ORAL | Status: DC | PRN

## 2016-08-10 MED ORDER — ONDANSETRON HCL 4 MG/2ML IJ SOLN: 4.0000 mg | Freq: Three times a day (TID) | INTRAMUSCULAR | Status: DC | PRN

## 2016-08-10 MED ORDER — IPRATROPIUM-ALBUTEROL 0.5-2.5 (3) MG/3ML IN SOLN
3.0000 mL | Freq: Four times a day (QID) | RESPIRATORY_TRACT | Status: DC
  Administered 2016-08-10: 3 mL via RESPIRATORY_TRACT
  Filled 2016-08-10: qty 3

## 2016-08-10 MED ORDER — IOPAMIDOL (ISOVUE-300) INJECTION 61%
INTRAVENOUS | Status: AC
Start: 2016-08-10 — End: 2016-08-10
  Administered 2016-08-10: 75 mL
  Filled 2016-08-10: qty 75

## 2016-08-10 MED ORDER — ZOLPIDEM TARTRATE 5 MG PO TABS
5.0000 mg | ORAL_TABLET | Freq: Every evening | ORAL | Status: DC | PRN
  Administered 2016-08-12 – 2016-08-14 (×4): 5 mg via ORAL
  Filled 2016-08-10 (×4): qty 1

## 2016-08-10 MED ORDER — AZITHROMYCIN 250 MG PO TABS
250.0000 mg | ORAL_TABLET | Freq: Every day | ORAL | Status: AC
  Administered 2016-08-11 – 2016-08-14 (×4): 250 mg via ORAL
  Filled 2016-08-10 (×4): qty 1

## 2016-08-10 MED ORDER — METHYLPREDNISOLONE SODIUM SUCC 125 MG IJ SOLR
60.0000 mg | Freq: Two times a day (BID) | INTRAMUSCULAR | Status: DC
  Administered 2016-08-10 – 2016-08-14 (×10): 60 mg via INTRAVENOUS
  Filled 2016-08-10 (×11): qty 2

## 2016-08-10 MED ORDER — AZITHROMYCIN 500 MG PO TABS
500.0000 mg | ORAL_TABLET | Freq: Every day | ORAL | Status: AC
  Administered 2016-08-10: 500 mg via ORAL
  Filled 2016-08-10: qty 1

## 2016-08-10 MED ORDER — DM-GUAIFENESIN ER 30-600 MG PO TB12
1.0000 | ORAL_TABLET | Freq: Two times a day (BID) | ORAL | Status: DC
  Administered 2016-08-10 – 2016-08-15 (×12): 1 via ORAL
  Filled 2016-08-10 (×12): qty 1

## 2016-08-10 MED ORDER — SODIUM CHLORIDE 0.9 % IV SOLN
INTRAVENOUS | Status: DC
  Administered 2016-08-10: 1000 mL via INTRAVENOUS
  Administered 2016-08-10 – 2016-08-13 (×5): via INTRAVENOUS

## 2016-08-10 MED ORDER — ALBUTEROL SULFATE (2.5 MG/3ML) 0.083% IN NEBU
2.5000 mg | INHALATION_SOLUTION | RESPIRATORY_TRACT | Status: DC | PRN
Start: 2016-08-10 — End: 2016-08-15

## 2016-08-10 MED ORDER — INFLUENZA VAC SPLIT QUAD 0.5 ML IM SUSY
0.5000 mL | PREFILLED_SYRINGE | INTRAMUSCULAR | Status: AC
  Administered 2016-08-15: 0.5 mL via INTRAMUSCULAR
  Filled 2016-08-10: qty 0.5

## 2016-08-10 MED ORDER — SODIUM CHLORIDE 0.9 % IV BOLUS (SEPSIS): 1000.0000 mL | Freq: Once | INTRAVENOUS | Status: DC

## 2016-08-10 MED ORDER — OXYCODONE-ACETAMINOPHEN 5-325 MG PO TABS
2.0000 | ORAL_TABLET | Freq: Four times a day (QID) | ORAL | Status: DC | PRN
Start: 2016-08-10 — End: 2016-08-15
  Administered 2016-08-10 – 2016-08-14 (×4): 2 via ORAL
  Filled 2016-08-10 (×4): qty 2

## 2016-08-10 NOTE — Consult Note (Signed)
PULMONARY / CRITICAL CARE MEDICINE   Name: Noah Roach MRN: [id number] DOB: 1971/07/27    ADMISSION DATE:  08/09/2016 CONSULTATION DATE: 08/10/2016  REFERRING MD:  Dr. Charlies Silvers, [facility]  CHIEF COMPLAINT:  Short of breath  HISTORY OF PRESENT ILLNESS:   45 yo male presented with cough, dyspnea, dizziness, and syncope.  His cough started in July 2017.  He has been getting pleuritic chest pain.  He had improvement in cough after therapy in July.  He had recent trip to [place] and his cough returned.  He was treated with antibiotics for pneumonia in August 2017.  He had CT chest which showed b/l ASD and LAN with atypical pattern.    He has been on several doses of antibiotics w/o improvement.  He was given 5 days of prednisone without improvement.  He has lost about 10 lbs.  He denies fever, chills, or hemoptysis.  No sweats, skin rash, joint swelling, or gland swelling.  He denies abdominal pain, nausea, or diarrhea.  He is originally from [place].  He has lived in [place] for about 15 yrs.  He works as a Administrator.  He denies animal/bird exposures.  No recent sick exposures.  He says his brother in [place] died two weeks ago, and was told he had growths in his lungs.  He thinks his mother had something similar.  He is not sure what disease they might have had.  PAST MEDICAL HISTORY :  He  has a past medical history of No pertinent past medical history and Pneumonia.  PAST SURGICAL HISTORY: He  has a past surgical history that includes No past surgeries and Lipoma excision (11/06/2011).  No Known Allergies  No current facility-administered medications on file prior to encounter.    Current Outpatient Prescriptions on File Prior to Encounter  Medication Sig  . levofloxacin (LEVAQUIN) 750 MG tablet Take 1 tablet (750 mg total) by mouth daily. (Patient not taking: Reported on 08/09/2016)    FAMILY HISTORY:  His indicated that his mother is deceased. He indicated that his father  is alive. He indicated that the status of his brother is unknown.    SOCIAL HISTORY: He  reports that he has never smoked. He has never used smokeless tobacco. He reports that he does not drink alcohol or use drugs.  REVIEW OF SYSTEMS:   Negative except above.  SUBJECTIVE:   VITAL SIGNS: BP 116/72 (BP Location: Left Arm)   Pulse 82   Temp 98.9 F (37.2 C) (Oral)   Resp 20   Ht 5\' 8"  (1.727 m)   Wt 234 lb (106.1 kg)   SpO2 98%   BMI 35.58 kg/m   INTAKE / OUTPUT: I/O last 3 completed shifts: In: 1648.3 [I.V.:15; IV Piggyback:1633.3] Out: 750 [Urine:750]  PHYSICAL EXAMINATION: General:  Alert, pleasant Neuro:  Normal strength, CN intact HEENT:  Pupils reactive, no oral exudate, MP 3, no LAN Cardiovascular:  Regular, no murmur Lungs:  B/l crackles, no wheeze Abdomen:  Soft, non tender, no organomegaly, +bowel sounds Musculoskeletal:  No edema, no clubbing Skin:  No rashes  LABS: CMP Latest Ref Rng & Units 08/09/2016 06/22/2016  Glucose 65 - 99 mg/dL 140(H) 104(H)  BUN 6 - 20 mg/dL 13 10  Creatinine 0.61 - 1.24 mg/dL 1.12 1.08  Sodium 135 - 145 mmol/L 138 137  Potassium 3.5 - 5.1 mmol/L 3.7 3.8  Chloride 101 - 111 mmol/L 102 106  CO2 22 - 32 mmol/L 25 21(L)  Calcium 8.9 - 10.3 mg/dL  9.7 9.1  Total Protein 6.5 - 8.1 g/dL 7.5 -  Total Bilirubin 0.3 - 1.2 mg/dL 0.5 -  Alkaline Phos 38 - 126 U/L 65 -  AST 15 - 41 U/L 24 -  ALT 17 - 63 U/L 21 -    CBC Latest Ref Rng & Units 08/09/2016 06/22/2016  WBC 4.0 - 10.5 K/uL 6.1 5.6  Hemoglobin 13.0 - 17.0 g/dL 12.7(L) 13.3  Hematocrit 39.0 - 52.0 % 39.2 41.5  Platelets 150 - 400 K/uL 381 215    IMAGING: Dg Chest 2 View  Result Date: 08/09/2016 CLINICAL DATA:  Pneumonia last month, persistent cough and shortness of breath EXAM: CHEST  2 VIEW COMPARISON:  07/24/2016 FINDINGS: Normal heart size, mediastinal contours, and pulmonary vascularity. Persistent pulmonary infiltrates diffusely throughout LEFT lung and at mid to  lower RIGHT lung. Probable component of atelectasis at minor fissure. No definite pleural effusion or pneumothorax. IMPRESSION: Persistent BILATERAL pulmonary infiltrates consistent with pneumonia, probably slightly increased on LEFT since previous exam. Electronically Signed   By: Lavonia Dana M.D.   On: 08/09/2016 20:59   Ct Chest W Contrast  Result Date: 08/10/2016 CLINICAL DATA:  Chronic cough and syncope.  Initial encounter. EXAM: CT CHEST WITH CONTRAST TECHNIQUE: Multidetector CT imaging of the chest was performed during intravenous contrast administration. CONTRAST:  75 mL ISOVUE-300 IOPAMIDOL (ISOVUE-300) INJECTION 61% COMPARISON:  Chest radiograph performed 08/09/2016 FINDINGS: Cardiovascular: The heart remains normal in size. The thoracic aorta is grossly unremarkable in appearance. The great vessels are within normal limits. No calcific atherosclerotic disease is seen. Mediastinum/Nodes: Diffusely enlarged mediastinal and bilateral hilar nodes are seen, measuring up to 2.0 cm at the subcarinal region, and 2.2 cm at the right hilum. Enlarged right paratracheal, periaortic, aortopulmonary window and azygoesophageal recess nodes are seen. No pericardial effusion is identified. The visualized portions of the thyroid gland are unremarkable. No axillary lymphadenopathy is seen. Lungs/Pleura: Diffuse interstitial opacification is noted at the left lower lobe, and peripheral patchy airspace opacity is seen within the remainder of both lungs, with nodular airspace opacities at the left upper lobe. No pleural effusion or pneumothorax is seen. No dominant mass is identified. Upper Abdomen: The visualized portions of the liver and the spleen are unremarkable in appearance. The gallbladder, visualized portions of the pancreas and adrenal glands are unremarkable. The visualized portions of the kidneys are within normal limits. Musculoskeletal: No acute osseous abnormalities are identified. The visualized  musculature is unremarkable in appearance. IMPRESSION: 1. Diffuse interstitial opacification of the left lower lobe. Peripheral patchy airspace opacity within the remainder of both lungs, with nodular airspace opacities at the left upper lobe. This is an unusual appearance, and could reflect lymphangitic spread of tumor, given extensive mediastinal and bilateral hilar lymphadenopathy, or an atypical chronic infection, or some form of interstitial lung disease or pneumonitis. Would correlate for evidence of a primary malignancy; the mediastinal or hilar nodes would be amenable to biopsy, as deemed clinically appropriate. 2. Diffusely enlarged mediastinal and bilateral hilar nodes seen, measuring up to 2.0 cm at the subcarinal region, and 2.2 cm at the right hilum. Electronically Signed   By: Garald Balding M.D.   On: 08/10/2016 01:24   Ct Maxillofacial Wo Contrast  Result Date: 08/10/2016 CLINICAL DATA:  Syncope. Hit face and mouth on concrete, with broken teeth. Initial encounter. EXAM: CT MAXILLOFACIAL WITHOUT CONTRAST TECHNIQUE: Multidetector CT imaging of the maxillofacial structures was performed. Multiplanar CT image reconstructions were also generated. A small metallic BB was placed on  the right temple in order to reliably differentiate right from left. COMPARISON:  None. FINDINGS: Osseous: There is a mildly displaced fracture through the central anterior edge of the maxilla, overlying the central maxillary incisors bilaterally, with mild anterior displacement of the central maxillary incisors. The maxilla and mandible appear intact. The nasal bone is unremarkable in appearance. The visualized dentition demonstrates no acute abnormality. Orbits: The orbits are intact bilaterally. Sinuses: Inspissated mucus is noted at the left maxillary sinus. The remaining visualized paranasal sinuses and mastoid air cells are well-aerated. Soft tissues: Soft tissue swelling is noted overlying the fracture site. The  parapharyngeal fat planes are preserved. The nasopharynx, oropharynx and hypopharynx are unremarkable in appearance. The visualized portions of the valleculae and piriform sinuses are grossly unremarkable.The parotid and submandibular glands are within normal limits. No cervical lymphadenopathy is seen. Limited intracranial: Prominence of the ventricles raises question for mild cortical volume loss. IMPRESSION: 1. Mildly displaced fracture through the central anterior edge of the maxilla, overlying the central maxillary incisors bilaterally, with mild anterior displacement of the central maxillary incisors. 2. Soft tissue swelling overlying the fracture site. 3. Inspissated mucus at the left maxillary sinus. Electronically Signed   By: Garald Balding M.D.   On: 08/10/2016 00:59     STUDIES:  CT chest 10/19 >> b/l hilar and mediastinal LAN up to 2.2 cm, diffuse interstitial ASD LLL, patchy peripheral ASD b/l, nodular ASD LUL  CULTURES: Blood 10/19 >> Respiratory viral panel 10/19 >> Pneumococcal Ag 10/19 >> Legionella Ag 10/19 >>  ANTIBIOTICS: Zithromax 10/19 >>  SIGNIFICANT EVENTS: 10/19 Admit  LINES/TUBES:  DISCUSSION: 45 yo male with progressive cough, dyspnea and persistent b/l pulmonary infiltrates with mediastinal and hilar lymphadenopathy.  He has not responded to several courses of outpatient antibiotics and prednisone.  He reports his brother died recently with similar symptoms.  Differential at this time includes atypical infection, inflammatory process, or malignancy.  ASSESSMENT / PLAN:  Pulmonary infiltrates with mediastinal/hilar LAN. - check serologies - will arrange for bronchoscopy >> risks detailed as bleeding, infection, pneumothorax, and non diagnosis - if bronchoscopy results unrevealing, then he might need VATS - continue Abx for now  Chesley Mires, MD [place] 08/10/2016, 12:31 PM Pager:  [phone] After 3pm call: [phone]

## 2016-08-10 NOTE — ED Notes (Signed)
Report called to Ivin Booty, RN at this time.  Receiving nurse denies having any further questions at this time.

## 2016-08-10 NOTE — Progress Notes (Signed)
Patient ID: Noah Roach, male   DOB: [birth year], 45 y.o.   MRN: [id number]  Patient admitted after midnight. For details please refer to admission note done 08/10/2016.  45 year old male without significant past medical history. He presented to [facility] with worsening cough and shortness of breath for past 3 months prior to this admission. He was seen initially in July 2017 for his symptoms, he initially improved, then he went to [place] and after that his cough returned. In August 2017 he was diagnosed with pneumonia and he was started on Levaquin. He finished 5 day course of antibiotic but his symptoms persisted. He was again seen in the clinic and treated with another antibiotic for 5 days with no significant improvement. On this admission, CXR showed bilateral persistent pulmonary infiltrates consistent with pneumonia. CT chest with contrast showed diffuse interstitial opacification of the left lower lobe and patchy airspace opacity in both lungs with nodular airspace opacities at the left upper lobe which could reflect lymphangitic spread of the tumor given extensive mediastinal and bilateral hilar lymphadenopathy or an atypical chronic infection.  Assessment and plan:  Shortness of breath / cough - Chest x-ray on this admission shows persistent pulmonary infiltrates consistent with pneumonia - CT scan with patchy airspace disease in both lungs along with mediastinal and hilar lymphadenopathy - Appreciate pulmonary consult and recommendations - Patient is currently on azithromycin - He is also Solu-Medrol 60 mg IV every 12 hours  Leisa Lenz [facility] [id number]

## 2016-08-10 NOTE — H&P (Signed)
History and Physical    Noah Roach [id number] DOB: October 03, 1971 DOA: 08/09/2016  Referring MD/NP/PA:   PCP: PROVIDER NOT IN SYSTEM   Patient coming from:  The patient is coming from home.  At baseline, pt is independent for most of ADL.   Chief Complaint: cough, SOB and syncope  HPI: Noah Roach is a 45 y.o. male without significant medical history, who presents with cough and SOB and syncope.  Pt states that he has been having dry cough since July of this year. It is associated with shortness of breath and chest pain. His chest pain is induced by coughing. He does not have chest pain at rest. No fever or chills. Pt states he was originally evaluated for this cough in July 2017 and was given meds which improved his symptoms. He then went on trip to [place] and after retuning his cough returned. He was also evaluated in the ED on 06/22/16 for cough and was diagnosed with PNA. He was treated with oral Levaquin as recommended by ID, Dr. Megan Salon. After he finished 5-day course of Abx, he did not feel better. He was seen in clinic and treated with another 5-day course of Abx without singnificant improvement (unsure of of the name of abx). Pt can speak in full sentence.  Pt states that he continues to have dry cough and SOB.  Today, due to one episode of severe coughing, he felt dizzy, passed out for about 10 seconds, fell on the ground, injured his face and lower lip. He also broke his 2 front teeth with this fall. Patient denies unilateral weakness, numbness or tingling in extremities. No vision change or hearing loss. Currently he has pain over bilateral cheeks. Patient does not have fever, chills, nausea, vomiting, diarrhea, symptoms of UTI.  ED Course: pt was found to have WBC 6.1, negative urinalysis, electrolytes and renal function okay, temperature normal, slightly tachycardia, respiration rate 27. Pt is admitted to tele bed as inpt.  # CXR showed persistent BILATERAL pulmonary  infiltrates consistent with pneumonia, probably slightly increased on LEFT since previous exam.  # CT-chest with contrast showed: 1. Diffuse interstitial opacification of the left lower lobe. Peripheral patchy airspace opacity within the remainder of both lungs, with nodular airspace opacities at the left upper lobe. This is an unusual appearance, and could reflect lymphangitic spread of tumor, given extensive mediastinal and bilateral hilar lymphadenopathy, or an atypical chronic infection, or some form of interstitial lung disease or pneumonitis. Would correlate for evidence of a primary malignancy; the mediastinal or hilar nodes would be amenable to biopsy, as deemed clinically appropriate. 2. Diffusely enlarged mediastinal and bilateral hilar nodes seen, measuring up to 2.0 cm at the subcarinal region, and 2.2 cm at the right hilum.  # CT-maxillofacial: 1. Mildly displaced fracture through the central anterior edge of the maxilla, overlying the central maxillary incisors bilaterally,  with mild anterior displacement of the central maxillary incisors. 2. Soft tissue swelling overlying the fracture site. 3. Inspissated mucus at the left maxillary sinus.  Review of Systems:   General: no fevers, chills, no changes in body weight, has poor appetite, has fatigue HEENT: no blurry vision, hearing changes or sore throat Respiratory: has dyspnea, coughing, wheezing CV: no chest pain, no palpitations GI: no nausea, vomiting, abdominal pain, diarrhea, constipation GU: no dysuria, burning on urination, increased urinary frequency, hematuria  Ext: no leg edema Neuro: no unilateral weakness, numbness, or tingling, no vision change or hearing loss Skin: no rash, no skin tear. MSK:  No muscle spasm, no deformity, no limitation of range of movement in spin Heme: No easy bruising.  Travel history: No recent long distant travel.  Allergy: No Known Allergies  Past Medical History:  Diagnosis Date  . No  pertinent past medical history   . Pneumonia     Past Surgical History:  Procedure Laterality Date  . LIPOMA EXCISION  11/06/2011   Procedure: EXCISION LIPOMA;  Surgeon: Rolm Bookbinder, MD;  Location: [facility];  Service: General;  Laterality: N/A;  Excision of back mass  . NO PAST SURGERIES      Social History:  reports that he has never smoked. He has never used smokeless tobacco. He reports that he does not drink alcohol or use drugs.  Family History:  Family History  Problem Relation Age of Onset  . Lung disease Mother   . Diabetes Mellitus II Father   . Hypertension Father   . Lung cancer Brother      Prior to Admission medications   Medication Sig Start Date End Date Taking? Authorizing Provider  levofloxacin (LEVAQUIN) 750 MG tablet Take 1 tablet (750 mg total) by mouth daily. Patient not taking: Reported on 08/09/2016 06/22/16   Okey Regal, PA-C    Physical Exam: Vitals:   08/10/16 0130 08/10/16 0202 08/10/16 0215 08/10/16 0359  BP: 134/87 (!) 137/103 (!) 140/101 116/72  Pulse: 87 89 91 82  Resp: 23 15 25 20   Temp:    98.9 F (37.2 C)  TempSrc:    Oral  SpO2: 97% 96% 98% 96%  Weight:      Height:    5\' 8"  (1.727 m)   General: Not in acute distress HEENT:       Eyes: PERRL, EOMI, no scleral icterus.       ENT: No discharge from the ears and nose, no pharynx injection, no tonsillar enlargement. Has cheek tenderness bilaterally.        Neck: No JVD, no bruit, no mass felt. Heme: No neck lymph node enlargement. Cardiac: S1/S2, RRR, No murmurs, No gallops or rubs. Respiratory: has mild wheezing bilaterally. No rales or rubs. GI: Soft, nondistended, nontender, no rebound pain, no organomegaly, BS present. GU: No hematuria Ext: No pitting leg edema bilaterally. 2+DP/PT pulse bilaterally. Musculoskeletal: No joint deformities, No joint redness or warmth, no limitation of ROM in spin. Skin: No rashes.  Neuro: Alert, oriented X3, cranial nerves  II-XII grossly intact, moves all extremities normally. Muscle strength 5/5 in all extremities, sensation to light touch intact. Knee reflex 1+ bilaterally. Negative Babinski's sign. Normal finger to nose test. Psych: Patient is not psychotic, no suicidal or hemocidal ideation.  Labs on Admission: I have personally reviewed following labs and imaging studies  CBC:  Recent Labs Lab 08/09/16 1952  WBC 6.1  NEUTROABS 3.5  HGB 12.7*  HCT 39.2  MCV 79.4  PLT 123XX123   Basic Metabolic Panel:  Recent Labs Lab 08/09/16 1952  NA 138  K 3.7  CL 102  CO2 25  GLUCOSE 140*  BUN 13  CREATININE 1.12  CALCIUM 9.7   GFR: Estimated Creatinine Clearance: 95.2 mL/min (by C-G formula based on SCr of 1.12 mg/dL). Liver Function Tests:  Recent Labs Lab 08/09/16 1952  AST 24  ALT 21  ALKPHOS 65  BILITOT 0.5  PROT 7.5  ALBUMIN 3.3*   No results for input(s): LIPASE, AMYLASE in the last 168 hours. No results for input(s): AMMONIA in the last 168 hours. Coagulation Profile: No results  for input(s): INR, PROTIME in the last 168 hours. Cardiac Enzymes: No results for input(s): CKTOTAL, CKMB, CKMBINDEX, TROPONINI in the last 168 hours. BNP (last 3 results) No results for input(s): PROBNP in the last 8760 hours. HbA1C: No results for input(s): HGBA1C in the last 72 hours. CBG: No results for input(s): GLUCAP in the last 168 hours. Lipid Profile: No results for input(s): CHOL, HDL, LDLCALC, TRIG, CHOLHDL, LDLDIRECT in the last 72 hours. Thyroid Function Tests: No results for input(s): TSH, T4TOTAL, FREET4, T3FREE, THYROIDAB in the last 72 hours. Anemia Panel: No results for input(s): VITAMINB12, FOLATE, FERRITIN, TIBC, IRON, RETICCTPCT in the last 72 hours. Urine analysis:    Component Value Date/Time   COLORURINE AMBER (A) 08/09/2016 1954   APPEARANCEUR CLEAR 08/09/2016 1954   LABSPEC 1.023 08/09/2016 1954   PHURINE 6.5 08/09/2016 1954   GLUCOSEU NEGATIVE 08/09/2016 1954   HGBUR  NEGATIVE 08/09/2016 [place] NEGATIVE 08/09/2016 1954   KETONESUR 15 (A) 08/09/2016 1954   PROTEINUR NEGATIVE 08/09/2016 1954   NITRITE NEGATIVE 08/09/2016 1954   LEUKOCYTESUR NEGATIVE 08/09/2016 1954   Sepsis Labs: @LABRCNTIP (procalcitonin:4,lacticidven:4) )No results found for this or any previous visit (from the past 240 hour(s)).   Radiological Exams on Admission: Dg Chest 2 View  Result Date: 08/09/2016 CLINICAL DATA:  Pneumonia last month, persistent cough and shortness of breath EXAM: CHEST  2 VIEW COMPARISON:  07/24/2016 FINDINGS: Normal heart size, mediastinal contours, and pulmonary vascularity. Persistent pulmonary infiltrates diffusely throughout LEFT lung and at mid to lower RIGHT lung. Probable component of atelectasis at minor fissure. No definite pleural effusion or pneumothorax. IMPRESSION: Persistent BILATERAL pulmonary infiltrates consistent with pneumonia, probably slightly increased on LEFT since previous exam. Electronically Signed   By: Lavonia Dana M.D.   On: 08/09/2016 20:59   Ct Chest W Contrast  Result Date: 08/10/2016 CLINICAL DATA:  Chronic cough and syncope.  Initial encounter. EXAM: CT CHEST WITH CONTRAST TECHNIQUE: Multidetector CT imaging of the chest was performed during intravenous contrast administration. CONTRAST:  75 mL ISOVUE-300 IOPAMIDOL (ISOVUE-300) INJECTION 61% COMPARISON:  Chest radiograph performed 08/09/2016 FINDINGS: Cardiovascular: The heart remains normal in size. The thoracic aorta is grossly unremarkable in appearance. The great vessels are within normal limits. No calcific atherosclerotic disease is seen. Mediastinum/Nodes: Diffusely enlarged mediastinal and bilateral hilar nodes are seen, measuring up to 2.0 cm at the subcarinal region, and 2.2 cm at the right hilum. Enlarged right paratracheal, periaortic, aortopulmonary window and azygoesophageal recess nodes are seen. No pericardial effusion is identified. The visualized portions  of the thyroid gland are unremarkable. No axillary lymphadenopathy is seen. Lungs/Pleura: Diffuse interstitial opacification is noted at the left lower lobe, and peripheral patchy airspace opacity is seen within the remainder of both lungs, with nodular airspace opacities at the left upper lobe. No pleural effusion or pneumothorax is seen. No dominant mass is identified. Upper Abdomen: The visualized portions of the liver and the spleen are unremarkable in appearance. The gallbladder, visualized portions of the pancreas and adrenal glands are unremarkable. The visualized portions of the kidneys are within normal limits. Musculoskeletal: No acute osseous abnormalities are identified. The visualized musculature is unremarkable in appearance. IMPRESSION: 1. Diffuse interstitial opacification of the left lower lobe. Peripheral patchy airspace opacity within the remainder of both lungs, with nodular airspace opacities at the left upper lobe. This is an unusual appearance, and could reflect lymphangitic spread of tumor, given extensive mediastinal and bilateral hilar lymphadenopathy, or an atypical chronic infection, or some form of  interstitial lung disease or pneumonitis. Would correlate for evidence of a primary malignancy; the mediastinal or hilar nodes would be amenable to biopsy, as deemed clinically appropriate. 2. Diffusely enlarged mediastinal and bilateral hilar nodes seen, measuring up to 2.0 cm at the subcarinal region, and 2.2 cm at the right hilum. Electronically Signed   By: Garald Balding M.D.   On: 08/10/2016 01:24   Ct Maxillofacial Wo Contrast  Result Date: 08/10/2016 CLINICAL DATA:  Syncope. Hit face and mouth on concrete, with broken teeth. Initial encounter. EXAM: CT MAXILLOFACIAL WITHOUT CONTRAST TECHNIQUE: Multidetector CT imaging of the maxillofacial structures was performed. Multiplanar CT image reconstructions were also generated. A small metallic BB was placed on the right temple in order  to reliably differentiate right from left. COMPARISON:  None. FINDINGS: Osseous: There is a mildly displaced fracture through the central anterior edge of the maxilla, overlying the central maxillary incisors bilaterally, with mild anterior displacement of the central maxillary incisors. The maxilla and mandible appear intact. The nasal bone is unremarkable in appearance. The visualized dentition demonstrates no acute abnormality. Orbits: The orbits are intact bilaterally. Sinuses: Inspissated mucus is noted at the left maxillary sinus. The remaining visualized paranasal sinuses and mastoid air cells are well-aerated. Soft tissues: Soft tissue swelling is noted overlying the fracture site. The parapharyngeal fat planes are preserved. The nasopharynx, oropharynx and hypopharynx are unremarkable in appearance. The visualized portions of the valleculae and piriform sinuses are grossly unremarkable.The parotid and submandibular glands are within normal limits. No cervical lymphadenopathy is seen. Limited intracranial: Prominence of the ventricles raises question for mild cortical volume loss. IMPRESSION: 1. Mildly displaced fracture through the central anterior edge of the maxilla, overlying the central maxillary incisors bilaterally, with mild anterior displacement of the central maxillary incisors. 2. Soft tissue swelling overlying the fracture site. 3. Inspissated mucus at the left maxillary sinus. Electronically Signed   By: Garald Balding M.D.   On: 08/10/2016 00:59     EKG: Independently reviewed.  Sinus rhythm, QTC 445, tachycardia, no ischemic change.   Assessment/Plan Principal Problem:   Cough Active Problems:   Recurrent pneumonia   Syncope   Fall   Cough and SOB: Etiology is not clear. Patient may have atypical pneumonia, but CT chest has unusual findings, which could reflect lymphangitic spread of tumor per radiologist. Pt's brother had lung cancer. Patient does not have fever and  leukocytosis, which is not consistent with typical pneumonia. Patient received 1 dose of vancomycin and Zosyn, will switch to azithromycin for possible atypical infection. Pt has mild wheezing on auscultation, indicating bronchic spasm component. Patient is not septic clinically. Lactate is normal. Hemodynamically stable.  -will admit to tele bed as inpt. - azithromycin - Mucinex for cough  - prn Albuterol Nebs, DuoNeb prn for SOB -solumedrol 60 mg bid - Urine legionella and S. pneumococcal antigen - Follow up blood culture x2, sputum culture and respiratory virus panel, plus Flu pcr - IVF: 2L of NS bolus in ED, followed by 100 mL per hour of NS  -please call pulmonology for consultation  Syncope and fall: Etiology is not clear. May be due to vasovagal syncope secondary to severe cough. CT maxillofacial showed mildly displaced fracture maxilla. Patient has very low risk of stroke. No focal neurological findings on physical examination, less likely to have stroke. -check orthostatic vital signs -Frequent neuro check -Pain control: When necessary Percocet -may need to discuss with ENT for fracture of maxilla.   DVT ppx: SQ Lovenox Code  Status: Full code Family Communication: None at bed side.  Disposition Plan:  Anticipate discharge back to previous home environment Consults called: none  Admission status: Inpatient/tele    Date of Service 08/10/2016    Ivor Costa [facility] Pager [phone]  If 7PM-7AM, please contact night-coverage [email or web address] Password [email or web address] 08/10/2016, 4:07 AM

## 2016-08-10 NOTE — ED Notes (Signed)
Pt returned from CT at this time via ED stretcher. Pt in no apparent distress at this time.   

## 2016-08-11 ENCOUNTER — Encounter (HOSPITAL_COMMUNITY)
Admission: EM | Disposition: A | Discharge status: Home / Self Care | Payer: Self-pay | Source: Home / Self Care | Attending: Internal Medicine

## 2016-08-11 ENCOUNTER — Inpatient Hospital Stay (HOSPITAL_COMMUNITY): Payer: PRIVATE HEALTH INSURANCE

## 2016-08-11 ENCOUNTER — Encounter (HOSPITAL_COMMUNITY): Payer: Self-pay

## 2016-08-11 DIAGNOSIS — J189 Pneumonia, unspecified organism: Principal | ICD-10-CM

## 2016-08-11 DIAGNOSIS — R918 Other nonspecific abnormal finding of lung field: Secondary | ICD-10-CM

## 2016-08-11 HISTORY — PX: VIDEO BRONCHOSCOPY: SHX5072

## 2016-08-11 LAB — ANTI-SCLERODERMA ANTIBODY: Scleroderma (Scl-70) (ENA) Antibody, IgG: <0.2 AI (ref 0.0–0.9)

## 2016-08-11 LAB — FANA STAINING PATTERNS: Spindle Apparatus Pattern: 1:320 {titer} — ABNORMAL HIGH

## 2016-08-11 LAB — LEGIONELLA PNEUMOPHILA SEROGP 1 UR AG: L. pneumophila Serogp 1 Ur Ag: NEGATIVE

## 2016-08-11 LAB — BODY FLUID CELL COUNT WITH DIFFERENTIAL
EOS FL: 1 %
Eos, Fluid: 5 %
Lymphs, Fluid: 32 %
Lymphs, Fluid: 35 %
Monocyte-Macrophage-Serous Fluid: 35 % — ABNORMAL LOW (ref 50–90)
Monocyte-Macrophage-Serous Fluid: 36 % — ABNORMAL LOW (ref 50–90)
Neutrophil Count, Fluid: 28 % — ABNORMAL HIGH (ref 0–25)
Neutrophil Count, Fluid: 28 % — ABNORMAL HIGH (ref 0–25)
Total Nucleated Cell Count, Fluid: 198 cu mm (ref 0–1000)
Total Nucleated Cell Count, Fluid: 203 cu mm (ref 0–1000)

## 2016-08-11 LAB — ANCA TITERS: C-ANCA: <1:20 {titer}

## 2016-08-11 LAB — ANTINUCLEAR ANTIBODIES, IFA: ANA Ab, IFA: POSITIVE — AB

## 2016-08-11 LAB — SJOGRENS SYNDROME-B EXTRACTABLE NUCLEAR ANTIBODY

## 2016-08-11 LAB — ANTI-JO 1 ANTIBODY, IGG

## 2016-08-11 LAB — SJOGRENS SYNDROME-A EXTRACTABLE NUCLEAR ANTIBODY

## 2016-08-11 LAB — RHEUMATOID FACTOR: RHEUMATOID FACTOR: 12.2 [IU]/mL (ref 0.0–13.9)

## 2016-08-11 LAB — SEDIMENTATION RATE: Sed Rate: 47 mm/hr — ABNORMAL HIGH (ref 0–16)

## 2016-08-11 SURGERY — BRONCHOSCOPY, WITH FLUOROSCOPY
Anesthesia: Moderate Sedation | Laterality: Bilateral

## 2016-08-11 MED ORDER — MIDAZOLAM HCL 10 MG/2ML IJ SOLN
INTRAMUSCULAR | Status: DC | PRN
  Administered 2016-08-11: 1 mg via INTRAVENOUS
  Administered 2016-08-11 (×4): 2 mg via INTRAVENOUS

## 2016-08-11 MED ORDER — MIDAZOLAM HCL 5 MG/ML IJ SOLN
INTRAMUSCULAR | Status: AC
  Filled 2016-08-11: qty 2

## 2016-08-11 MED ORDER — FENTANYL CITRATE (PF) 100 MCG/2ML IJ SOLN
INTRAMUSCULAR | Status: AC
  Filled 2016-08-11: qty 4

## 2016-08-11 MED ORDER — ENOXAPARIN SODIUM 40 MG/0.4ML ~~LOC~~ SOLN
40.0000 mg | SUBCUTANEOUS | Status: DC
  Administered 2016-08-12 – 2016-08-14 (×3): 40 mg via SUBCUTANEOUS
  Filled 2016-08-11 (×4): qty 0.4

## 2016-08-11 MED ORDER — FENTANYL CITRATE (PF) 100 MCG/2ML IJ SOLN
INTRAMUSCULAR | Status: DC | PRN
  Administered 2016-08-11 (×4): 50 ug via INTRAVENOUS

## 2016-08-11 MED ORDER — LIDOCAINE HCL (PF) 1 % IJ SOLN
INTRAMUSCULAR | Status: DC | PRN
  Administered 2016-08-11: 6 mL

## 2016-08-11 NOTE — Progress Notes (Signed)
Video bronchoscopy with washing intervention, biopsy intervention. All vitals good thru out procedure. Report called to Shirley Muscat RN.

## 2016-08-11 NOTE — Progress Notes (Signed)
Lab called with a critical lab value from biopsy of bilateral lungs that both contained bacteria. Paging MD.

## 2016-08-11 NOTE — Progress Notes (Signed)
[facility] PROGRESS NOTE  Noah Roach KX:[id number] DOB: December 01, 1970 DOA: 08/09/2016   PCP: PROVIDER NOT IN SYSTEM     Assessment/Plan: Principal Problem:   Cough Active Problems:   Recurrent pneumonia   Syncope   Fall   Pulmonary infiltrates   45 year old male without significant past medical history. He presented to [facility] with worsening cough and shortness of breath for past 3 months prior to this admission. He was seen initially in July 2017 for his symptoms, he initially improved, then he went to [place] and after that his cough returned. In August 2017 he was diagnosed with pneumonia and he was started on Levaquin. He finished 5 day course of antibiotic but his symptoms persisted. He was again seen in the clinic and treated with another antibiotic for 5 days with no significant improvement. On this admission, CXR showed bilateral persistent pulmonary infiltrates consistent with pneumonia. CT chest with contrast showed diffuse interstitial opacification of the left lower lobe and patchy airspace opacity in both lungs with nodular airspace opacities at the left upper lobe which could reflect lymphangitic spread of the tumor given extensive mediastinal and bilateral hilar lymphadenopathy or an atypical chronic infection  Assessment and plan Intractable cough/pulmonary infiltrates/hilar lymphadenopathy He has not responded to several courses of outpatient antibiotics and prednisone.  He reports his brother died recently with similar symptoms. Differential at this time includes atypical infection, inflammatory process, or malignancy   Serologies pending Bronchoscopy , if negative may need VATS-timing of bronchoscopy deferred to pulmonology Pulmonary following Continue antibiotics and steroids  Syncope and fall: Etiology is not clear. May be due to vasovagal syncope secondary to severe cough. CT maxillofacial showed mildly displaced fracture maxilla. Patient has  very low risk of stroke. No focal neurological findings on physical examination, less likely to have stroke. -check orthostatic vital signs  Patient could have vasovagal syncope related to his cough Will check 2-D echo Telemetry consistent with sinus tachycardia    DVT prophylaxsis Lovenox  Code Status:  Full code     Family Communication: Discussed in detail with the patient, all imaging results, lab results explained to the patient   Disposition Plan: As per pulmonary       Consultants:  Pulmonary  Procedures:  Bronchoscopy  Antibiotics: Anti-infectives    Start     Dose/Rate Route Frequency Ordered Stop   08/11/16 1000  azithromycin (ZITHROMAX) tablet 250 mg     250 mg Oral Daily 08/10/16 0236 08/15/16 0959   08/10/16 1000  azithromycin (ZITHROMAX) tablet 500 mg     500 mg Oral Daily 08/10/16 0236 08/10/16 1041   08/09/16 2330  vancomycin (VANCOCIN) 1,500 mg in sodium chloride 0.9 % 500 mL IVPB     1,500 mg 250 mL/hr over 120 Minutes Intravenous  Once 08/09/16 2325 08/10/16 0302   08/09/16 2330  ceFEPIme (MAXIPIME) 2 g in dextrose 5 % 50 mL IVPB     2 g 100 mL/hr over 30 Minutes Intravenous  Once 08/09/16 2325 08/10/16 0059         HPI/Subjective: Denies any cough or chest pain this morning, denies shortness of breath  Objective: Vitals:   08/10/16 0848 08/10/16 1356 08/10/16 2033 08/11/16 0556  BP:  128/78 133/84 119/74  Pulse:  86 100 87  Resp:   18 18  Temp:  98.3 F (36.8 C) 98.6 F (37 C) 98.2 F (36.8 C)  TempSrc:  Oral Oral Oral  SpO2: 98% 92% 91% 95%  Weight:      Height:        Intake/Output Summary (Last 24 hours) at 08/11/16 0840 Last data filed at 08/11/16 0452  Gross per 24 hour  Intake                0 ml  Output             2170 ml  Net            -2170 ml    Exam:  Examination:  General exam: Appears calm and comfortable  Respiratory system: Clear to auscultation. Respiratory effort normal. Cardiovascular system: S1  & S2 heard, RRR. No JVD, murmurs, rubs, gallops or clicks. No pedal edema. Gastrointestinal system: Abdomen is nondistended, soft and nontender. No organomegaly or masses felt. Normal bowel sounds heard. Central nervous system: Alert and oriented. No focal neurological deficits. Extremities: Symmetric 5 x 5 power. Skin: No rashes, lesions or ulcers Psychiatry: Judgement and insight appear normal. Mood & affect appropriate.     Data Reviewed: I have personally reviewed following labs and imaging studies  Micro Results Recent Results (from the past 240 hour(s))  Respiratory Panel by PCR     Status: Abnormal   Collection Time: 08/10/16  2:35 AM  Result Value Ref Range Status   Adenovirus NOT DETECTED NOT DETECTED Final   Coronavirus 229E NOT DETECTED NOT DETECTED Final   Coronavirus HKU1 NOT DETECTED NOT DETECTED Final   Coronavirus NL63 NOT DETECTED NOT DETECTED Final   Coronavirus OC43 NOT DETECTED NOT DETECTED Final   Metapneumovirus NOT DETECTED NOT DETECTED Final   Rhinovirus / Enterovirus DETECTED (A) NOT DETECTED Final   Influenza A NOT DETECTED NOT DETECTED Final   Influenza B NOT DETECTED NOT DETECTED Final   Parainfluenza Virus 1 NOT DETECTED NOT DETECTED Final   Parainfluenza Virus 2 NOT DETECTED NOT DETECTED Final   Parainfluenza Virus 3 NOT DETECTED NOT DETECTED Final   Parainfluenza Virus 4 NOT DETECTED NOT DETECTED Final   Respiratory Syncytial Virus NOT DETECTED NOT DETECTED Final   Bordetella pertussis NOT DETECTED NOT DETECTED Final   Chlamydophila pneumoniae NOT DETECTED NOT DETECTED Final   Mycoplasma pneumoniae NOT DETECTED NOT DETECTED Final    Radiology Reports Dg Chest 2 View  Result Date: 08/09/2016 CLINICAL DATA:  Pneumonia last month, persistent cough and shortness of breath EXAM: CHEST  2 VIEW COMPARISON:  07/24/2016 FINDINGS: Normal heart size, mediastinal contours, and pulmonary vascularity. Persistent pulmonary infiltrates diffusely throughout LEFT  lung and at mid to lower RIGHT lung. Probable component of atelectasis at minor fissure. No definite pleural effusion or pneumothorax. IMPRESSION: Persistent BILATERAL pulmonary infiltrates consistent with pneumonia, probably slightly increased on LEFT since previous exam. Electronically Signed   By: Lavonia Dana M.D.   On: 08/09/2016 20:59   Dg Chest 2 View  Result Date: 07/24/2016 CLINICAL DATA:  Pneumonia.  Shortness of breath. EXAM: CHEST  2 VIEW COMPARISON:  06/22/2016. FINDINGS: Heart size normal. Progressive multifocal bilateral patchy infiltrates are noted. No prominent pleural effusion or pneumothorax. Heart size normal. No acute bony abnormality . IMPRESSION: Progressive or recurrent multifocal bilateral patchy pulmonary infiltrates consistent with multifocal pneumonia. Close follow-up exams to demonstrate resolution. If there is no resolution contrast-enhanced CT of the chest suggested for further evaluation. Electronically Signed   By: Marcello Moores  Register   On: 07/24/2016 11:00   Ct Chest W Contrast  Result Date: 08/10/2016 CLINICAL DATA:  Chronic cough and syncope.  Initial encounter. EXAM: CT CHEST WITH  CONTRAST TECHNIQUE: Multidetector CT imaging of the chest was performed during intravenous contrast administration. CONTRAST:  75 mL ISOVUE-300 IOPAMIDOL (ISOVUE-300) INJECTION 61% COMPARISON:  Chest radiograph performed 08/09/2016 FINDINGS: Cardiovascular: The heart remains normal in size. The thoracic aorta is grossly unremarkable in appearance. The great vessels are within normal limits. No calcific atherosclerotic disease is seen. Mediastinum/Nodes: Diffusely enlarged mediastinal and bilateral hilar nodes are seen, measuring up to 2.0 cm at the subcarinal region, and 2.2 cm at the right hilum. Enlarged right paratracheal, periaortic, aortopulmonary window and azygoesophageal recess nodes are seen. No pericardial effusion is identified. The visualized portions of the thyroid gland are  unremarkable. No axillary lymphadenopathy is seen. Lungs/Pleura: Diffuse interstitial opacification is noted at the left lower lobe, and peripheral patchy airspace opacity is seen within the remainder of both lungs, with nodular airspace opacities at the left upper lobe. No pleural effusion or pneumothorax is seen. No dominant mass is identified. Upper Abdomen: The visualized portions of the liver and the spleen are unremarkable in appearance. The gallbladder, visualized portions of the pancreas and adrenal glands are unremarkable. The visualized portions of the kidneys are within normal limits. Musculoskeletal: No acute osseous abnormalities are identified. The visualized musculature is unremarkable in appearance. IMPRESSION: 1. Diffuse interstitial opacification of the left lower lobe. Peripheral patchy airspace opacity within the remainder of both lungs, with nodular airspace opacities at the left upper lobe. This is an unusual appearance, and could reflect lymphangitic spread of tumor, given extensive mediastinal and bilateral hilar lymphadenopathy, or an atypical chronic infection, or some form of interstitial lung disease or pneumonitis. Would correlate for evidence of a primary malignancy; the mediastinal or hilar nodes would be amenable to biopsy, as deemed clinically appropriate. 2. Diffusely enlarged mediastinal and bilateral hilar nodes seen, measuring up to 2.0 cm at the subcarinal region, and 2.2 cm at the right hilum. Electronically Signed   By: Garald Balding M.D.   On: 08/10/2016 01:24   Ct Maxillofacial Wo Contrast  Result Date: 08/10/2016 CLINICAL DATA:  Syncope. Hit face and mouth on concrete, with broken teeth. Initial encounter. EXAM: CT MAXILLOFACIAL WITHOUT CONTRAST TECHNIQUE: Multidetector CT imaging of the maxillofacial structures was performed. Multiplanar CT image reconstructions were also generated. A small metallic BB was placed on the right temple in order to reliably differentiate  right from left. COMPARISON:  None. FINDINGS: Osseous: There is a mildly displaced fracture through the central anterior edge of the maxilla, overlying the central maxillary incisors bilaterally, with mild anterior displacement of the central maxillary incisors. The maxilla and mandible appear intact. The nasal bone is unremarkable in appearance. The visualized dentition demonstrates no acute abnormality. Orbits: The orbits are intact bilaterally. Sinuses: Inspissated mucus is noted at the left maxillary sinus. The remaining visualized paranasal sinuses and mastoid air cells are well-aerated. Soft tissues: Soft tissue swelling is noted overlying the fracture site. The parapharyngeal fat planes are preserved. The nasopharynx, oropharynx and hypopharynx are unremarkable in appearance. The visualized portions of the valleculae and piriform sinuses are grossly unremarkable.The parotid and submandibular glands are within normal limits. No cervical lymphadenopathy is seen. Limited intracranial: Prominence of the ventricles raises question for mild cortical volume loss. IMPRESSION: 1. Mildly displaced fracture through the central anterior edge of the maxilla, overlying the central maxillary incisors bilaterally, with mild anterior displacement of the central maxillary incisors. 2. Soft tissue swelling overlying the fracture site. 3. Inspissated mucus at the left maxillary sinus. Electronically Signed   By: Francoise Schaumann.D.  On: 08/10/2016 00:59     CBC  Recent Labs Lab 08/09/16 1952  WBC 6.1  HGB 12.7*  HCT 39.2  PLT 381  MCV 79.4  MCH 25.7*  MCHC 32.4  RDW 13.5  LYMPHSABS 1.4  MONOABS 0.9  EOSABS 0.3  BASOSABS 0.0    Chemistries   Recent Labs Lab 08/09/16 1952  NA 138  K 3.7  CL 102  CO2 25  GLUCOSE 140*  BUN 13  CREATININE 1.12  CALCIUM 9.7  AST 24  ALT 21  ALKPHOS 65  BILITOT 0.5    ------------------------------------------------------------------------------------------------------------------ estimated creatinine clearance is 97.3 mL/min (by C-G formula based on SCr of 1.12 mg/dL). ------------------------------------------------------------------------------------------------------------------ No results for input(s): HGBA1C in the last 72 hours. ------------------------------------------------------------------------------------------------------------------ No results for input(s): CHOL, HDL, LDLCALC, TRIG, CHOLHDL, LDLDIRECT in the last 72 hours. ------------------------------------------------------------------------------------------------------------------ No results for input(s): TSH, T4TOTAL, T3FREE, THYROIDAB in the last 72 hours.  Invalid input(s): FREET3 ------------------------------------------------------------------------------------------------------------------ No results for input(s): VITAMINB12, FOLATE, FERRITIN, TIBC, IRON, RETICCTPCT in the last 72 hours.  Coagulation profile  Recent Labs Lab 08/10/16 0413  INR 1.07    No results for input(s): DDIMER in the last 72 hours.  Cardiac Enzymes No results for input(s): CKMB, TROPONINI, MYOGLOBIN in the last 168 hours.  Invalid input(s): CK ------------------------------------------------------------------------------------------------------------------ Invalid input(s): POCBNP   CBG: No results for input(s): GLUCAP in the last 168 hours.     Studies: Dg Chest 2 View  Result Date: 08/09/2016 CLINICAL DATA:  Pneumonia last month, persistent cough and shortness of breath EXAM: CHEST  2 VIEW COMPARISON:  07/24/2016 FINDINGS: Normal heart size, mediastinal contours, and pulmonary vascularity. Persistent pulmonary infiltrates diffusely throughout LEFT lung and at mid to lower RIGHT lung. Probable component of atelectasis at minor fissure. No definite pleural effusion or pneumothorax.  IMPRESSION: Persistent BILATERAL pulmonary infiltrates consistent with pneumonia, probably slightly increased on LEFT since previous exam. Electronically Signed   By: Lavonia Dana M.D.   On: 08/09/2016 20:59   Ct Chest W Contrast  Result Date: 08/10/2016 CLINICAL DATA:  Chronic cough and syncope.  Initial encounter. EXAM: CT CHEST WITH CONTRAST TECHNIQUE: Multidetector CT imaging of the chest was performed during intravenous contrast administration. CONTRAST:  75 mL ISOVUE-300 IOPAMIDOL (ISOVUE-300) INJECTION 61% COMPARISON:  Chest radiograph performed 08/09/2016 FINDINGS: Cardiovascular: The heart remains normal in size. The thoracic aorta is grossly unremarkable in appearance. The great vessels are within normal limits. No calcific atherosclerotic disease is seen. Mediastinum/Nodes: Diffusely enlarged mediastinal and bilateral hilar nodes are seen, measuring up to 2.0 cm at the subcarinal region, and 2.2 cm at the right hilum. Enlarged right paratracheal, periaortic, aortopulmonary window and azygoesophageal recess nodes are seen. No pericardial effusion is identified. The visualized portions of the thyroid gland are unremarkable. No axillary lymphadenopathy is seen. Lungs/Pleura: Diffuse interstitial opacification is noted at the left lower lobe, and peripheral patchy airspace opacity is seen within the remainder of both lungs, with nodular airspace opacities at the left upper lobe. No pleural effusion or pneumothorax is seen. No dominant mass is identified. Upper Abdomen: The visualized portions of the liver and the spleen are unremarkable in appearance. The gallbladder, visualized portions of the pancreas and adrenal glands are unremarkable. The visualized portions of the kidneys are within normal limits. Musculoskeletal: No acute osseous abnormalities are identified. The visualized musculature is unremarkable in appearance. IMPRESSION: 1. Diffuse interstitial opacification of the left lower lobe.  Peripheral patchy airspace opacity within the remainder of both lungs, with nodular airspace opacities at the  left upper lobe. This is an unusual appearance, and could reflect lymphangitic spread of tumor, given extensive mediastinal and bilateral hilar lymphadenopathy, or an atypical chronic infection, or some form of interstitial lung disease or pneumonitis. Would correlate for evidence of a primary malignancy; the mediastinal or hilar nodes would be amenable to biopsy, as deemed clinically appropriate. 2. Diffusely enlarged mediastinal and bilateral hilar nodes seen, measuring up to 2.0 cm at the subcarinal region, and 2.2 cm at the right hilum. Electronically Signed   By: Garald Balding M.D.   On: 08/10/2016 01:24   Ct Maxillofacial Wo Contrast  Result Date: 08/10/2016 CLINICAL DATA:  Syncope. Hit face and mouth on concrete, with broken teeth. Initial encounter. EXAM: CT MAXILLOFACIAL WITHOUT CONTRAST TECHNIQUE: Multidetector CT imaging of the maxillofacial structures was performed. Multiplanar CT image reconstructions were also generated. A small metallic BB was placed on the right temple in order to reliably differentiate right from left. COMPARISON:  None. FINDINGS: Osseous: There is a mildly displaced fracture through the central anterior edge of the maxilla, overlying the central maxillary incisors bilaterally, with mild anterior displacement of the central maxillary incisors. The maxilla and mandible appear intact. The nasal bone is unremarkable in appearance. The visualized dentition demonstrates no acute abnormality. Orbits: The orbits are intact bilaterally. Sinuses: Inspissated mucus is noted at the left maxillary sinus. The remaining visualized paranasal sinuses and mastoid air cells are well-aerated. Soft tissues: Soft tissue swelling is noted overlying the fracture site. The parapharyngeal fat planes are preserved. The nasopharynx, oropharynx and hypopharynx are unremarkable in appearance. The  visualized portions of the valleculae and piriform sinuses are grossly unremarkable.The parotid and submandibular glands are within normal limits. No cervical lymphadenopathy is seen. Limited intracranial: Prominence of the ventricles raises question for mild cortical volume loss. IMPRESSION: 1. Mildly displaced fracture through the central anterior edge of the maxilla, overlying the central maxillary incisors bilaterally, with mild anterior displacement of the central maxillary incisors. 2. Soft tissue swelling overlying the fracture site. 3. Inspissated mucus at the left maxillary sinus. Electronically Signed   By: Garald Balding M.D.   On: 08/10/2016 00:59      No results found for: HGBA1C Lab Results  Component Value Date   CREATININE 1.12 08/09/2016       Scheduled Meds: . azithromycin  250 mg Oral Daily  . dextromethorphan-guaiFENesin  1 tablet Oral BID  . enoxaparin (LOVENOX) injection  40 mg Subcutaneous Q24H  . Influenza vac split quadrivalent PF  0.5 mL Intramuscular Tomorrow-1000  . methylPREDNISolone (SOLU-MEDROL) injection  60 mg Intravenous Q12H   Continuous Infusions: . sodium chloride 100 mL/hr at 08/10/16 1834     LOS: 1 day    Time spent: >30 MINS    [facility]  [facility] Pager [phone]. If 7PM-7AM, please contact night-coverage at [email or web address], password [facility] 08/11/2016, 8:40 AM  LOS: 1 day

## 2016-08-11 NOTE — Op Note (Signed)
[facility] Cardiopulmonary Patient Name: Noah Roach Date: 08/11/2016 MRN: [id number] Attending MD: Chesley Mires , MD Date of Birth: 1971/02/06 CSN: Finalized Age: 45 Admit Type: Inpatient Gender: Male Procedure:            Bronchoscopy Indications:          Diffuse infiltrate, Interstitial lung disease Providers:            Chesley Mires, MD, Doris Cheadle RRT,RCP, Tammie Readling                        RRT,RCP Referring MD:          Medicines:            Fentanyl 200 mcg IV, Midazolam 9 mg IV, Lidocaine 2%                        applied to cords 6 mL, Oxygen 5 L/min Complications:        No immediate complications Estimated Blood Loss: Estimated blood loss: none. Procedure:            Pre-Anesthesia Assessment:                       - A History and Physical has been performed. The                        patient's medications, allergies and sensitivities have                        been reviewed.                       - The risks and benefits of the procedure and the                        sedation options and risks were discussed with the                        patient. All questions were answered and informed                        consent was obtained.                       - Patient identification and proposed procedure were                        verified prior to the procedure by the physician and                        the technician. The procedure was verified in the                        endoscopy suite.                       After obtaining informed consent, the bronchoscope was                        passed under direct vision. Throughout the procedure,  the patient's blood pressure, pulse, and oxygen                        saturations were monitored continuously. the                        Bronchoscope was introduced through the mouth and                        advanced to the tracheobronchial tree of both lungs.                       The procedure was accomplished with moderate difficulty                        due to the patient's coughing. Successful completion of                        the procedure was aided by increasing the dose of                        sedation medication. The patient tolerated the                        procedure fairly well. The total duration of the                        procedure was 21 minutes. Total fluoroscopy time was 12                        seconds. Scope In: 2:11:13 PM Scope Out: 2:31:38 PM Findings:      The oropharynx appears normal. The larynx appears normal. The vocal       cords appear normal. The subglottic space is normal. The trachea is of       normal caliber. The carina is sharp. The tracheobronchial tree was       examined to at least the first subsegmental level. Bronchial mucosa and       anatomy are normal; there are no endobronchial lesions, and no       secretions.      The nasopharynx/oropharynx appears normal. The larynx appears normal.       The vocal cords appear normal. The subglottic space is normal. The       trachea is of normal caliber. The carina is sharp. The tracheobronchial       tree of the left lung was examined to at least the first subsegmental       level. Bronchial mucosa and anatomy in the left lung are normal; there       are no endobronchial lesions, and no secretions.      The nasopharynx/oropharynx appears normal. The larynx appears normal.       The vocal cords appear normal. The subglottic space is normal. The       trachea is of normal caliber. The carina is sharp. The tracheobronchial       tree of the right lung was examined to at least the first subsegmental       level. Bronchial mucosa and anatomy in the right lung are normal; there       are no endobronchial lesions, and no secretions.  Transbronchial biopsies of an area of infiltration were performed in the       left lower lobe using alligator forceps. The  procedure was guided by       fluoroscopy. Biopsy of lung tissue was obtained. Two biopsy passes were       performed. Two biopsy samples were obtained.      Bronchoalveolar lavage was performed in the RUL anterior segment (B3) of       the lung and sent for cell count, bacterial culture, viral smears &       culture, and fungal & AFB analysis and cytology. 60 mL of fluid were       instilled. 15 mL were returned. The return was cloudy.      Bronchoalveolar lavage was performed in the LUL apical posterior       segments (B1 & B2) of the lung and sent for cell count, bacterial       culture, viral smears & culture, and fungal & AFB analysis and cytology.       60 mL of fluid were instilled. 15 mL were returned. The return was       cloudy. Impression:           - Diffuse infiltrate                       - Interstitial lung disease                       - The examination was normal.                       - The left lung was normal.                       - The right lung was normal.                       - Transbronchial lung biopsies were performed.                       - Bronchoalveolar lavage was performed.                       - Bronchoalveolar lavage was performed. Moderate Sedation:      Moderate (conscious) sedation was personally administered by the       endoscopist. The following parameters were monitored: oxygen saturation,       heart rate, blood pressure, and response to care. Total physician       intraservice time was 21 minutes. Recommendation:       - Await BAL, biopsy, culture and cytology results. Procedure Code(s):    --- Professional ---                       [id number], Bronchoscopy, rigid or flexible, including                        fluoroscopic guidance, when performed; with                        transbronchial lung biopsy(s), single lobe                       [id number], Bronchoscopy, rigid or flexible, including  fluoroscopic guidance, when  performed; with bronchial                        alveolar lavage                       [phone], Bronchoscopy, rigid or flexible, including                        fluoroscopic guidance, when performed; with bronchial                        alveolar lavage                       [id number], Moderate sedation services provided by the same                        physician or other qualified health care professional                        performing the diagnostic or therapeutic service that                        the sedation supports, requiring the presence of an                        independent trained observer to assist in the                        monitoring of the patient's level of consciousness and                        physiological status; initial 15 minutes of                        intraservice time, patient age 21 years or older Diagnosis Code(s):    --- Professional ---                       R91.8, Other nonspecific abnormal finding of lung field                       J84.9, Interstitial pulmonary disease, unspecified CPT copyright 2016 [employer]. All rights reserved. The codes documented in this report are preliminary and upon coder review may  be revised to meet current compliance requirements. Chesley Mires, MD Chesley Mires, MD 08/11/2016 2:57:55 PM This report has been signed electronically. Number of Addenda: 0

## 2016-08-12 LAB — CBC
HEMATOCRIT: 37 % — AB (ref 39.0–52.0)
Hemoglobin: 12.1 g/dL — ABNORMAL LOW (ref 13.0–17.0)
MCH: 25.7 pg — ABNORMAL LOW (ref 26.0–34.0)
MCHC: 32.7 g/dL (ref 30.0–36.0)
MCV: 78.6 fL (ref 78.0–100.0)
PLATELETS: 381 10*3/uL (ref 150–400)
RBC: 4.71 MIL/uL (ref 4.22–5.81)
RDW: 13.3 % (ref 11.5–15.5)
WBC: 13.1 10*3/uL — ABNORMAL HIGH (ref 4.0–10.5)

## 2016-08-12 LAB — ACID FAST SMEAR (AFB): ACID FAST SMEAR - AFSCU2: NEGATIVE

## 2016-08-12 LAB — COMPREHENSIVE METABOLIC PANEL
ALBUMIN: 2.8 g/dL — AB (ref 3.5–5.0)
ALT: 19 U/L (ref 17–63)
ANION GAP: 7 (ref 5–15)
AST: 22 U/L (ref 15–41)
Alkaline Phosphatase: 57 U/L (ref 38–126)
BILIRUBIN TOTAL: 0.3 mg/dL (ref 0.3–1.2)
BUN: 18 mg/dL (ref 6–20)
CHLORIDE: 105 mmol/L (ref 101–111)
CO2: 24 mmol/L (ref 22–32)
Calcium: 8.8 mg/dL — ABNORMAL LOW (ref 8.9–10.3)
Creatinine, Ser: 0.96 mg/dL (ref 0.61–1.24)
GFR calc Af Amer: >60 mL/min (ref >60)
GLUCOSE: 124 mg/dL — AB (ref 65–99)
POTASSIUM: 3.8 mmol/L (ref 3.5–5.1)
Sodium: 136 mmol/L (ref 135–145)
TOTAL PROTEIN: 6.4 g/dL — AB (ref 6.5–8.1)

## 2016-08-12 LAB — ACID FAST SMEAR (AFB, MYCOBACTERIA): Acid Fast Smear: NEGATIVE

## 2016-08-12 NOTE — Progress Notes (Signed)
[facility] PROGRESS NOTE  Noah Roach [id number] DOB: Nov 27, 1970 DOA: 08/09/2016   PCP: PROVIDER NOT IN SYSTEM     Assessment/Plan: Principal Problem:   Cough Active Problems:   Recurrent pneumonia   Syncope   Fall   Pulmonary infiltrates   45 year old male without significant past medical history. He presented to [facility] with worsening cough and shortness of breath for past 3 months prior to this admission. He was seen initially in July 2017 for his symptoms, he initially improved, then he went to [place] and after that his cough returned. In August 2017 he was diagnosed with pneumonia and he was started on Levaquin. He finished 5 day course of antibiotic but his symptoms persisted. He was again seen in the clinic and treated with another antibiotic for 5 days with no significant improvement. On this admission, CXR showed bilateral persistent pulmonary infiltrates consistent with pneumonia. CT chest with contrast showed diffuse interstitial opacification of the left lower lobe and patchy airspace opacity in both lungs with nodular airspace opacities at the left upper lobe which could reflect lymphangitic spread of the tumor given extensive mediastinal and bilateral hilar lymphadenopathy or an atypical chronic infection  Assessment and plan Intractable cough/pulmonary infiltrates/hilar lymphadenopathy Respiratory virus panel positive for rhinovirus However this is chronic, He has not responded to several courses of outpatient antibiotics and prednisone.  He reports his brother died recently with similar symptoms. Differential at this time includes atypical infection, inflammatory process, or malignancy   Serologies pending Status post Bronchoscopy 10/20, Gram stain consistent with gram-negative rods, gram-positive cocci in pairs  if negative may need VATS-timing of bronchoscopy deferred to pulmonology Pulmonary following Continue antibiotics and  steroids  Syncope and fall: Etiology is not clear. May be due to vasovagal syncope secondary to severe cough. CT maxillofacial showed mildly displaced fracture maxilla. Patient has very low risk of stroke. No focal neurological findings on physical examination, less likely to have stroke. -check orthostatic vital signs  Patient could have vasovagal syncope related to his cough 2-D echo pending Telemetry consistent with sinus tachycardia    DVT prophylaxsis Lovenox  Code Status:  Full code     Family Communication: Discussed in detail with the patient, all imaging results, lab results explained to the patient   Disposition Plan: As per pulmonary       Consultants:  Pulmonary  Procedures:  Bronchoscopy  Antibiotics: Anti-infectives    Start     Dose/Rate Route Frequency Ordered Stop   08/11/16 1000  azithromycin (ZITHROMAX) tablet 250 mg     250 mg Oral Daily 08/10/16 0236 08/15/16 0959   08/10/16 1000  azithromycin (ZITHROMAX) tablet 500 mg     500 mg Oral Daily 08/10/16 0236 08/10/16 1041   08/09/16 2330  vancomycin (VANCOCIN) 1,500 mg in sodium chloride 0.9 % 500 mL IVPB     1,500 mg 250 mL/hr over 120 Minutes Intravenous  Once 08/09/16 2325 08/10/16 0302   08/09/16 2330  ceFEPIme (MAXIPIME) 2 g in dextrose 5 % 50 mL IVPB     2 g 100 mL/hr over 30 Minutes Intravenous  Once 08/09/16 2325 08/10/16 0059         HPI/Subjective: Denies any chest pain, afebrile overnight, denies any significant cough  Objective: Vitals:   08/11/16 1455 08/11/16 1500 08/11/16 2041 08/12/16 0617  BP: (!) 145/72 (!) 142/96 135/88 116/74  Pulse: (!) 104 (!) 102 91 86  Resp: 17 (!) 23 (!) 22 20  Temp:   98.5 F (36.9 C) 98.2 F (36.8 C)  TempSrc:   Oral Oral  SpO2: 93% 92% 97% 95%  Weight:      Height:        Intake/Output Summary (Last 24 hours) at 08/12/16 0814 Last data filed at 08/11/16 1723  Gross per 24 hour  Intake                0 ml  Output             1200 ml   Net            -1200 ml    Exam:  Examination:  General exam: Appears calm and comfortable  Respiratory system: Clear to auscultation. Respiratory effort normal. Cardiovascular system: S1 & S2 heard, RRR. No JVD, murmurs, rubs, gallops or clicks. No pedal edema. Gastrointestinal system: Abdomen is nondistended, soft and nontender. No organomegaly or masses felt. Normal bowel sounds heard. Central nervous system: Alert and oriented. No focal neurological deficits. Extremities: Symmetric 5 x 5 power. Skin: No rashes, lesions or ulcers Psychiatry: Judgement and insight appear normal. Mood & affect appropriate.     Data Reviewed: I have personally reviewed following labs and imaging studies  Micro Results Recent Results (from the past 240 hour(s))  Culture, blood (Routine X 2) w Reflex to ID Panel     Status: None (Preliminary result)   Collection Time: 08/10/16  1:05 AM  Result Value Ref Range Status   Specimen Description BLOOD LEFT ARM  Final   Special Requests BOTTLES DRAWN AEROBIC AND ANAEROBIC 5ML  Final   Culture NO GROWTH 1 DAY  Final   Report Status PENDING  Incomplete  Culture, blood (Routine X 2) w Reflex to ID Panel     Status: None (Preliminary result)   Collection Time: 08/10/16  1:10 AM  Result Value Ref Range Status   Specimen Description BLOOD LEFT HAND  Final   Special Requests BOTTLES DRAWN AEROBIC AND ANAEROBIC 5ML  Final   Culture NO GROWTH 1 DAY  Final   Report Status PENDING  Incomplete  Respiratory Panel by PCR     Status: Abnormal   Collection Time: 08/10/16  2:35 AM  Result Value Ref Range Status   Adenovirus NOT DETECTED NOT DETECTED Final   Coronavirus 229E NOT DETECTED NOT DETECTED Final   Coronavirus HKU1 NOT DETECTED NOT DETECTED Final   Coronavirus NL63 NOT DETECTED NOT DETECTED Final   Coronavirus OC43 NOT DETECTED NOT DETECTED Final   Metapneumovirus NOT DETECTED NOT DETECTED Final   Rhinovirus / Enterovirus DETECTED (A) NOT DETECTED Final    Influenza A NOT DETECTED NOT DETECTED Final   Influenza B NOT DETECTED NOT DETECTED Final   Parainfluenza Virus 1 NOT DETECTED NOT DETECTED Final   Parainfluenza Virus 2 NOT DETECTED NOT DETECTED Final   Parainfluenza Virus 3 NOT DETECTED NOT DETECTED Final   Parainfluenza Virus 4 NOT DETECTED NOT DETECTED Final   Respiratory Syncytial Virus NOT DETECTED NOT DETECTED Final   Bordetella pertussis NOT DETECTED NOT DETECTED Final   Chlamydophila pneumoniae NOT DETECTED NOT DETECTED Final   Mycoplasma pneumoniae NOT DETECTED NOT DETECTED Final  Culture, respiratory (NON-Expectorated)     Status: None (Preliminary result)   Collection Time: 08/11/16  2:37 PM  Result Value Ref Range Status   Specimen Description BRONCHIAL ALVEOLAR LAVAGE  Final   Special Requests LEFT UPPER LOBE  Final   Gram Stain   Final    MODERATE WBC  PRESENT, PREDOMINANTLY MONONUCLEAR RARE GRAM NEGATIVE RODS RARE GRAM POSITIVE COCCI IN PAIRS    Culture PENDING  Incomplete   Report Status PENDING  Incomplete  Culture, respiratory (NON-Expectorated)     Status: None (Preliminary result)   Collection Time: 08/11/16  2:40 PM  Result Value Ref Range Status   Specimen Description BRONCHIAL ALVEOLAR LAVAGE  Final   Special Requests RIGHT UPPER LOBE  Final   Gram Stain   Final    RARE WBC PRESENT,BOTH PMN AND MONONUCLEAR FEW GRAM NEGATIVE RODS FEW GRAM POSITIVE COCCI IN PAIRS    Culture PENDING  Incomplete   Report Status PENDING  Incomplete    Radiology Reports Dg Chest 2 View  Result Date: 08/09/2016 CLINICAL DATA:  Pneumonia last month, persistent cough and shortness of breath EXAM: CHEST  2 VIEW COMPARISON:  07/24/2016 FINDINGS: Normal heart size, mediastinal contours, and pulmonary vascularity. Persistent pulmonary infiltrates diffusely throughout LEFT lung and at mid to lower RIGHT lung. Probable component of atelectasis at minor fissure. No definite pleural effusion or pneumothorax. IMPRESSION: Persistent  BILATERAL pulmonary infiltrates consistent with pneumonia, probably slightly increased on LEFT since previous exam. Electronically Signed   By: Lavonia Dana M.D.   On: 08/09/2016 20:59   Dg Chest 2 View  Result Date: 07/24/2016 CLINICAL DATA:  Pneumonia.  Shortness of breath. EXAM: CHEST  2 VIEW COMPARISON:  06/22/2016. FINDINGS: Heart size normal. Progressive multifocal bilateral patchy infiltrates are noted. No prominent pleural effusion or pneumothorax. Heart size normal. No acute bony abnormality . IMPRESSION: Progressive or recurrent multifocal bilateral patchy pulmonary infiltrates consistent with multifocal pneumonia. Close follow-up exams to demonstrate resolution. If there is no resolution contrast-enhanced CT of the chest suggested for further evaluation. Electronically Signed   By: Marcello Moores  Register   On: 07/24/2016 11:00   Ct Chest W Contrast  Result Date: 08/10/2016 CLINICAL DATA:  Chronic cough and syncope.  Initial encounter. EXAM: CT CHEST WITH CONTRAST TECHNIQUE: Multidetector CT imaging of the chest was performed during intravenous contrast administration. CONTRAST:  75 mL ISOVUE-300 IOPAMIDOL (ISOVUE-300) INJECTION 61% COMPARISON:  Chest radiograph performed 08/09/2016 FINDINGS: Cardiovascular: The heart remains normal in size. The thoracic aorta is grossly unremarkable in appearance. The great vessels are within normal limits. No calcific atherosclerotic disease is seen. Mediastinum/Nodes: Diffusely enlarged mediastinal and bilateral hilar nodes are seen, measuring up to 2.0 cm at the subcarinal region, and 2.2 cm at the right hilum. Enlarged right paratracheal, periaortic, aortopulmonary window and azygoesophageal recess nodes are seen. No pericardial effusion is identified. The visualized portions of the thyroid gland are unremarkable. No axillary lymphadenopathy is seen. Lungs/Pleura: Diffuse interstitial opacification is noted at the left lower lobe, and peripheral patchy airspace  opacity is seen within the remainder of both lungs, with nodular airspace opacities at the left upper lobe. No pleural effusion or pneumothorax is seen. No dominant mass is identified. Upper Abdomen: The visualized portions of the liver and the spleen are unremarkable in appearance. The gallbladder, visualized portions of the pancreas and adrenal glands are unremarkable. The visualized portions of the kidneys are within normal limits. Musculoskeletal: No acute osseous abnormalities are identified. The visualized musculature is unremarkable in appearance. IMPRESSION: 1. Diffuse interstitial opacification of the left lower lobe. Peripheral patchy airspace opacity within the remainder of both lungs, with nodular airspace opacities at the left upper lobe. This is an unusual appearance, and could reflect lymphangitic spread of tumor, given extensive mediastinal and bilateral hilar lymphadenopathy, or an atypical chronic infection, or some form  of interstitial lung disease or pneumonitis. Would correlate for evidence of a primary malignancy; the mediastinal or hilar nodes would be amenable to biopsy, as deemed clinically appropriate. 2. Diffusely enlarged mediastinal and bilateral hilar nodes seen, measuring up to 2.0 cm at the subcarinal region, and 2.2 cm at the right hilum. Electronically Signed   By: Garald Balding M.D.   On: 08/10/2016 01:24   Dg Chest Port 1 View  Result Date: 08/11/2016 CLINICAL DATA:  Status post bronchoscopy. EXAM: PORTABLE CHEST 1 VIEW COMPARISON:  Radiographs of August 09, 2016. FINDINGS: Stable cardiomediastinal silhouette. No pneumothorax is noted. Stable diffuse left lung opacities and right midlung and basilar opacities compared to prior exam, most consistent with pneumonia. Bony thorax is unremarkable. IMPRESSION: Stable bilateral lung opacities as described above, most consistent with pneumonia. Electronically Signed   By: Marijo Conception, M.D.   On: 08/11/2016 15:06   Ct  Maxillofacial Wo Contrast  Result Date: 08/10/2016 CLINICAL DATA:  Syncope. Hit face and mouth on concrete, with broken teeth. Initial encounter. EXAM: CT MAXILLOFACIAL WITHOUT CONTRAST TECHNIQUE: Multidetector CT imaging of the maxillofacial structures was performed. Multiplanar CT image reconstructions were also generated. A small metallic BB was placed on the right temple in order to reliably differentiate right from left. COMPARISON:  None. FINDINGS: Osseous: There is a mildly displaced fracture through the central anterior edge of the maxilla, overlying the central maxillary incisors bilaterally, with mild anterior displacement of the central maxillary incisors. The maxilla and mandible appear intact. The nasal bone is unremarkable in appearance. The visualized dentition demonstrates no acute abnormality. Orbits: The orbits are intact bilaterally. Sinuses: Inspissated mucus is noted at the left maxillary sinus. The remaining visualized paranasal sinuses and mastoid air cells are well-aerated. Soft tissues: Soft tissue swelling is noted overlying the fracture site. The parapharyngeal fat planes are preserved. The nasopharynx, oropharynx and hypopharynx are unremarkable in appearance. The visualized portions of the valleculae and piriform sinuses are grossly unremarkable.The parotid and submandibular glands are within normal limits. No cervical lymphadenopathy is seen. Limited intracranial: Prominence of the ventricles raises question for mild cortical volume loss. IMPRESSION: 1. Mildly displaced fracture through the central anterior edge of the maxilla, overlying the central maxillary incisors bilaterally, with mild anterior displacement of the central maxillary incisors. 2. Soft tissue swelling overlying the fracture site. 3. Inspissated mucus at the left maxillary sinus. Electronically Signed   By: Garald Balding M.D.   On: 08/10/2016 00:59   Dg C-arm Bronchoscopy  Result Date: 08/11/2016 CLINICAL  DATA:  C-ARM BRONCHOSCOPY Fluoroscopy was utilized by the requesting physician.  No radiographic interpretation.     CBC  Recent Labs Lab 08/09/16 1952 08/12/16 0333  WBC 6.1 13.1*  HGB 12.7* 12.1*  HCT 39.2 37.0*  PLT 381 381  MCV 79.4 78.6  MCH 25.7* 25.7*  MCHC 32.4 32.7  RDW 13.5 13.3  LYMPHSABS 1.4  --   MONOABS 0.9  --   EOSABS 0.3  --   BASOSABS 0.0  --     Chemistries   Recent Labs Lab 08/09/16 1952 08/12/16 0333  NA 138 136  K 3.7 3.8  CL 102 105  CO2 25 24  GLUCOSE 140* 124*  BUN 13 18  CREATININE 1.12 0.96  CALCIUM 9.7 8.8*  AST 24 22  ALT 21 19  ALKPHOS 65 57  BILITOT 0.5 0.3   ------------------------------------------------------------------------------------------------------------------ estimated creatinine clearance is 111.1 mL/min (by C-G formula based on SCr of 0.96 mg/dL). ------------------------------------------------------------------------------------------------------------------ No results  for input(s): HGBA1C in the last 72 hours. ------------------------------------------------------------------------------------------------------------------ No results for input(s): CHOL, HDL, LDLCALC, TRIG, CHOLHDL, LDLDIRECT in the last 72 hours. ------------------------------------------------------------------------------------------------------------------ No results for input(s): TSH, T4TOTAL, T3FREE, THYROIDAB in the last 72 hours.  Invalid input(s): FREET3 ------------------------------------------------------------------------------------------------------------------ No results for input(s): VITAMINB12, FOLATE, FERRITIN, TIBC, IRON, RETICCTPCT in the last 72 hours.  Coagulation profile  Recent Labs Lab 08/10/16 0413  INR 1.07    No results for input(s): DDIMER in the last 72 hours.  Cardiac Enzymes No results for input(s): CKMB, TROPONINI, MYOGLOBIN in the last 168 hours.  Invalid input(s):  CK ------------------------------------------------------------------------------------------------------------------ Invalid input(s): POCBNP   CBG: No results for input(s): GLUCAP in the last 168 hours.     Studies: Dg Chest Port 1 View  Result Date: 08/11/2016 CLINICAL DATA:  Status post bronchoscopy. EXAM: PORTABLE CHEST 1 VIEW COMPARISON:  Radiographs of August 09, 2016. FINDINGS: Stable cardiomediastinal silhouette. No pneumothorax is noted. Stable diffuse left lung opacities and right midlung and basilar opacities compared to prior exam, most consistent with pneumonia. Bony thorax is unremarkable. IMPRESSION: Stable bilateral lung opacities as described above, most consistent with pneumonia. Electronically Signed   By: Marijo Conception, M.D.   On: 08/11/2016 15:06   Dg C-arm Bronchoscopy  Result Date: 08/11/2016 CLINICAL DATA:  C-ARM BRONCHOSCOPY Fluoroscopy was utilized by the requesting physician.  No radiographic interpretation.      No results found for: HGBA1C Lab Results  Component Value Date   CREATININE 0.96 08/12/2016       Scheduled Meds: . azithromycin  250 mg Oral Daily  . dextromethorphan-guaiFENesin  1 tablet Oral BID  . enoxaparin (LOVENOX) injection  40 mg Subcutaneous Q24H  . Influenza vac split quadrivalent PF  0.5 mL Intramuscular Tomorrow-1000  . methylPREDNISolone (SOLU-MEDROL) injection  60 mg Intravenous Q12H   Continuous Infusions: . sodium chloride 100 mL/hr at 08/12/16 0123     LOS: 2 days    Time spent: >30 MINS    [facility]  [facility] Pager [phone]. If 7PM-7AM, please contact night-coverage at [email or web address], password [facility] 08/12/2016, 8:14 AM  LOS: 2 days

## 2016-08-12 NOTE — Progress Notes (Signed)
PULMONARY / CRITICAL CARE MEDICINE   Name: Noah Roach MRN: [id number] DOB: Dec 18, 1970    ADMISSION DATE:  08/09/2016 CONSULTATION DATE: 08/10/2016  REFERRING MD:  Dr. Charlies Silvers, [facility]  CHIEF COMPLAINT:  Short of breath  SUBJECTIVE:  Breathing better.  Wants to shower.  VITAL SIGNS: BP 116/74 (BP Location: Left Arm)   Pulse 86   Temp 98.2 F (36.8 C) (Oral)   Resp 20   Ht 5\' 8"  (1.727 m)   Wt 224 lb (101.6 kg)   SpO2 95%   BMI 34.06 kg/m   INTAKE / OUTPUT: I/O last 3 completed shifts: In: -  Out: 2700 [Urine:2700]  PHYSICAL EXAMINATION: General:  Alert, pleasant Neuro:  Normal strength HEENT:  No stridor Cardiovascular:  Regular, no murmur Lungs:  B/l crackles, better air movement Abdomen:  Soft, non tender Musculoskeletal:  No edema Skin:  No rashes  LABS: CMP Latest Ref Rng & Units 08/12/2016 08/09/2016 06/22/2016  Glucose 65 - 99 mg/dL 124(H) 140(H) 104(H)  BUN 6 - 20 mg/dL 18 13 10   Creatinine 0.61 - 1.24 mg/dL 0.96 1.12 1.08  Sodium 135 - 145 mmol/L 136 138 137  Potassium 3.5 - 5.1 mmol/L 3.8 3.7 3.8  Chloride 101 - 111 mmol/L 105 102 106  CO2 22 - 32 mmol/L 24 25 21(L)  Calcium 8.9 - 10.3 mg/dL 8.8(L) 9.7 9.1  Total Protein 6.5 - 8.1 g/dL 6.4(L) 7.5 -  Total Bilirubin 0.3 - 1.2 mg/dL 0.3 0.5 -  Alkaline Phos 38 - 126 U/L 57 65 -  AST 15 - 41 U/L 22 24 -  ALT 17 - 63 U/L 19 21 -    CBC Latest Ref Rng & Units 08/12/2016 08/09/2016 06/22/2016  WBC 4.0 - 10.5 K/uL 13.1(H) 6.1 5.6  Hemoglobin 13.0 - 17.0 g/dL 12.1(L) 12.7(L) 13.3  Hematocrit 39.0 - 52.0 % 37.0(L) 39.2 41.5  Platelets 150 - 400 K/uL 381 381 215    IMAGING: Dg Chest Port 1 View  Result Date: 08/11/2016 CLINICAL DATA:  Status post bronchoscopy. EXAM: PORTABLE CHEST 1 VIEW COMPARISON:  Radiographs of August 09, 2016. FINDINGS: Stable cardiomediastinal silhouette. No pneumothorax is noted. Stable diffuse left lung opacities and right midlung and basilar opacities compared to prior  exam, most consistent with pneumonia. Bony thorax is unremarkable. IMPRESSION: Stable bilateral lung opacities as described above, most consistent with pneumonia. Electronically Signed   By: Marijo Conception, M.D.   On: 08/11/2016 15:06   Dg C-arm Bronchoscopy  Result Date: 08/11/2016 CLINICAL DATA:  C-ARM BRONCHOSCOPY Fluoroscopy was utilized by the requesting physician.  No radiographic interpretation.     STUDIES:  CT chest 10/19 >> b/l hilar and mediastinal LAN up to 2.2 cm, diffuse interstitial ASD LLL, patchy peripheral ASD b/l, nodular ASD LUL Serology 10/20 >>  Bronchoscopy 10/20 >>  CULTURES: Blood 10/19 >> Respiratory viral panel 10/19 >> Rhinovirus Pneumococcal Ag 10/19 >> negative Legionella Ag 10/19 >> negative BAL RUL 10/20 >> BAL LUL 10/20 >> AFB BAL 10/20 >> Fungal BAL 10/20 >> PCP BAL 10/20 >>  ANTIBIOTICS: Zithromax 10/19 >>  SIGNIFICANT EVENTS: 10/19 Admit  LINES/TUBES:  DISCUSSION: 45 yo male with progressive cough, dyspnea and persistent b/l pulmonary infiltrates with mediastinal and hilar lymphadenopathy.  He has not responded to several courses of outpatient antibiotics and prednisone.  He reports his brother died recently with similar symptoms.  Differential at this time includes atypical infection, inflammatory process, or malignancy.  Rhinovirus positive on respiratory PCR 10/19.  ANA positive from  10/20 with spindle apparatus pattern.  ASSESSMENT / PLAN:  Pulmonary infiltrates with mediastinal/hilar LAN. - f/u bronchoscopy and serology results from 10/20 when available - continue zithromax and solumedrol - continue Abx for now  Chesley Mires, MD [place] 08/12/2016, 10:06 AM Pager:  [phone] After 3pm call: [phone]

## 2016-08-13 ENCOUNTER — Inpatient Hospital Stay (HOSPITAL_COMMUNITY): Payer: PRIVATE HEALTH INSURANCE

## 2016-08-13 DIAGNOSIS — R079 Chest pain, unspecified: Secondary | ICD-10-CM

## 2016-08-13 LAB — CULTURE, RESPIRATORY W GRAM STAIN: Culture: NORMAL

## 2016-08-13 LAB — CULTURE, RESPIRATORY: CULTURE: NORMAL

## 2016-08-13 NOTE — Progress Notes (Signed)
[facility] PROGRESS NOTE  Criselda Peaches KX:[id number] DOB: 1971-01-17 DOA: 08/09/2016   PCP: PROVIDER NOT IN SYSTEM     Assessment/Plan: Principal Problem:   Cough Active Problems:   Recurrent pneumonia   Syncope   Fall   Pulmonary infiltrates   45 year old male without significant past medical history. He presented to [facility] with worsening cough and shortness of breath for past 3 months prior to this admission. He was seen initially in July 2017 for his symptoms, he initially improved, then he went to [place] and after that his cough returned. In August 2017 he was diagnosed with pneumonia and he was started on Levaquin. He finished 5 day course of antibiotic but his symptoms persisted. He was again seen in the clinic and treated with another antibiotic for 5 days with no significant improvement. On this admission, CXR showed bilateral persistent pulmonary infiltrates consistent with pneumonia. CT chest with contrast showed diffuse interstitial opacification of the left lower lobe and patchy airspace opacity in both lungs with nodular airspace opacities at the left upper lobe which could reflect lymphangitic spread of the tumor given extensive mediastinal and bilateral hilar lymphadenopathy or an atypical chronic infection  Assessment and plan Intractable cough/pulmonary infiltrates/hilar lymphadenopathy Respiratory virus panel positive for rhinovirus However this is chronic, He has not responded to several courses of outpatient antibiotics and prednisone.  He reports his brother died recently with similar symptoms. Differential at this time includes atypical infection, inflammatory process, or malignancy Serum   Serologies  Essentially negative  Status post Bronchoscopy 10/20, Gram stain consistent with gram-negative rods, gram-positive cocci in pairs Consistent with normal respiratory flora.  if negative may need VATS-timing of bronchoscopy deferred to  pulmonology Pulmonary following Continue antibiotics and steroids  Syncope and fall: Etiology is not clear. May be due to vasovagal syncope secondary to severe cough. CT maxillofacial showed mildly displaced fracture maxilla. Patient has very low risk of stroke. No focal neurological findings on physical examination, less likely to have stroke. -check orthostatic vital signs  Patient could have vasovagal syncope related to his cough 2-D echo pending Telemetry consistent with sinus tachycardia    DVT prophylaxsis Lovenox  Code Status:  Full code     Family Communication: Discussed in detail with the patient, all imaging results, lab results explained to the patient   Disposition Plan: As per pulmonary       Consultants:  Pulmonary  Procedures:  Bronchoscopy  Antibiotics: Anti-infectives    Start     Dose/Rate Route Frequency Ordered Stop   08/11/16 1000  azithromycin (ZITHROMAX) tablet 250 mg     250 mg Oral Daily 08/10/16 0236 08/15/16 0959   08/10/16 1000  azithromycin (ZITHROMAX) tablet 500 mg     500 mg Oral Daily 08/10/16 0236 08/10/16 1041   08/09/16 2330  vancomycin (VANCOCIN) 1,500 mg in sodium chloride 0.9 % 500 mL IVPB     1,500 mg 250 mL/hr over 120 Minutes Intravenous  Once 08/09/16 2325 08/10/16 0302   08/09/16 2330  ceFEPIme (MAXIPIME) 2 g in dextrose 5 % 50 mL IVPB     2 g 100 mL/hr over 30 Minutes Intravenous  Once 08/09/16 2325 08/10/16 0059         HPI/Subjective: Slight cough     Objective: Vitals:   08/12/16 0617 08/12/16 1447 08/12/16 2042 08/13/16 0527  BP: 116/74 (!) 141/98 126/76 115/78  Pulse: 86  84 79  Resp: 20 19 18 18   Temp:  98.2 F (36.8 C) 97.9 F (36.6 C) 98.7 F (37.1 C) 98.5 F (36.9 C)  TempSrc: Oral Oral Oral Oral  SpO2: 95% 96% 92% 94%  Weight:      Height:        Intake/Output Summary (Last 24 hours) at 08/13/16 1305 Last data filed at 08/13/16 1229  Gross per 24 hour  Intake              240 ml   Output             4475 ml  Net            -4235 ml    Exam:  Examination:  General exam: Appears calm and comfortable  Respiratory system: Clear to auscultation. Respiratory effort normal. Cardiovascular system: S1 & S2 heard, RRR. No JVD, murmurs, rubs, gallops or clicks. No pedal edema. Gastrointestinal system: Abdomen is nondistended, soft and nontender. No organomegaly or masses felt. Normal bowel sounds heard. Central nervous system: Alert and oriented. No focal neurological deficits. Extremities: Symmetric 5 x 5 power. Skin: No rashes, lesions or ulcers Psychiatry: Judgement and insight appear normal. Mood & affect appropriate.     Data Reviewed: I have personally reviewed following labs and imaging studies  Micro Results Recent Results (from the past 240 hour(s))  Culture, blood (Routine X 2) w Reflex to ID Panel     Status: None (Preliminary result)   Collection Time: 08/10/16  1:05 AM  Result Value Ref Range Status   Specimen Description BLOOD LEFT ARM  Final   Special Requests BOTTLES DRAWN AEROBIC AND ANAEROBIC 5ML  Final   Culture NO GROWTH 2 DAYS  Final   Report Status PENDING  Incomplete  Culture, blood (Routine X 2) w Reflex to ID Panel     Status: None (Preliminary result)   Collection Time: 08/10/16  1:10 AM  Result Value Ref Range Status   Specimen Description BLOOD LEFT HAND  Final   Special Requests BOTTLES DRAWN AEROBIC AND ANAEROBIC 5ML  Final   Culture NO GROWTH 2 DAYS  Final   Report Status PENDING  Incomplete  Respiratory Panel by PCR     Status: Abnormal   Collection Time: 08/10/16  2:35 AM  Result Value Ref Range Status   Adenovirus NOT DETECTED NOT DETECTED Final   Coronavirus 229E NOT DETECTED NOT DETECTED Final   Coronavirus HKU1 NOT DETECTED NOT DETECTED Final   Coronavirus NL63 NOT DETECTED NOT DETECTED Final   Coronavirus OC43 NOT DETECTED NOT DETECTED Final   Metapneumovirus NOT DETECTED NOT DETECTED Final   Rhinovirus / Enterovirus  DETECTED (A) NOT DETECTED Final   Influenza A NOT DETECTED NOT DETECTED Final   Influenza B NOT DETECTED NOT DETECTED Final   Parainfluenza Virus 1 NOT DETECTED NOT DETECTED Final   Parainfluenza Virus 2 NOT DETECTED NOT DETECTED Final   Parainfluenza Virus 3 NOT DETECTED NOT DETECTED Final   Parainfluenza Virus 4 NOT DETECTED NOT DETECTED Final   Respiratory Syncytial Virus NOT DETECTED NOT DETECTED Final   Bordetella pertussis NOT DETECTED NOT DETECTED Final   Chlamydophila pneumoniae NOT DETECTED NOT DETECTED Final   Mycoplasma pneumoniae NOT DETECTED NOT DETECTED Final  Acid Fast Smear (AFB)     Status: None   Collection Time: 08/11/16  2:37 PM  Result Value Ref Range Status   AFB Specimen Processing Concentration  Final   Acid Fast Smear Negative  Final    Comment: (NOTE) Performed At: [facility] [address]  [place], [place] [id number] Lindon Romp MD [id number]    Source (AFB) BRONCHIAL ALVEOLAR LAVAGE  Final    Comment: LEFT UPPER LOBE  Culture, fungus without smear     Status: None (Preliminary result)   Collection Time: 08/11/16  2:37 PM  Result Value Ref Range Status   Specimen Description BRONCHIAL ALVEOLAR LAVAGE  Final   Special Requests LEFT UPPER LOBE  Final   Culture NO FUNGUS ISOLATED AFTER 2 DAYS  Final   Report Status PENDING  Incomplete  Culture, respiratory (NON-Expectorated)     Status: None   Collection Time: 08/11/16  2:37 PM  Result Value Ref Range Status   Specimen Description BRONCHIAL ALVEOLAR LAVAGE  Final   Special Requests LEFT UPPER LOBE  Final   Gram Stain   Final    MODERATE WBC PRESENT, PREDOMINANTLY MONONUCLEAR RARE GRAM NEGATIVE RODS RARE GRAM POSITIVE COCCI IN PAIRS    Culture Consistent with normal respiratory flora.  Final   Report Status 08/13/2016 FINAL  Final  Acid Fast Smear (AFB)     Status: None   Collection Time: 08/11/16  2:40 PM  Result Value Ref Range Status   AFB Specimen Processing Concentration   Final   Acid Fast Smear Negative  Final    Comment: (NOTE) Performed At: [facility] [address] [place], [place] [id number] Lindon Romp MD [id number]    Source (AFB) BRONCHIAL ALVEOLAR LAVAGE  Final    Comment: RIGHT UPPER LOBE  Culture, fungus without smear     Status: None (Preliminary result)   Collection Time: 08/11/16  2:40 PM  Result Value Ref Range Status   Specimen Description BRONCHIAL ALVEOLAR LAVAGE  Final   Special Requests RIGHT UPPER LOBE  Final   Culture NO FUNGUS ISOLATED AFTER 2 DAYS  Final   Report Status PENDING  Incomplete  Culture, respiratory (NON-Expectorated)     Status: None   Collection Time: 08/11/16  2:40 PM  Result Value Ref Range Status   Specimen Description BRONCHIAL ALVEOLAR LAVAGE  Final   Special Requests RIGHT UPPER LOBE  Final   Gram Stain   Final    RARE WBC PRESENT,BOTH PMN AND MONONUCLEAR FEW GRAM NEGATIVE RODS FEW GRAM POSITIVE COCCI IN PAIRS    Culture Consistent with normal respiratory flora.  Final   Report Status 08/13/2016 FINAL  Final    Radiology Reports Dg Chest 2 View  Result Date: 08/09/2016 CLINICAL DATA:  Pneumonia last month, persistent cough and shortness of breath EXAM: CHEST  2 VIEW COMPARISON:  07/24/2016 FINDINGS: Normal heart size, mediastinal contours, and pulmonary vascularity. Persistent pulmonary infiltrates diffusely throughout LEFT lung and at mid to lower RIGHT lung. Probable component of atelectasis at minor fissure. No definite pleural effusion or pneumothorax. IMPRESSION: Persistent BILATERAL pulmonary infiltrates consistent with pneumonia, probably slightly increased on LEFT since previous exam. Electronically Signed   By: Lavonia Dana M.D.   On: 08/09/2016 20:59   Dg Chest 2 View  Result Date: 07/24/2016 CLINICAL DATA:  Pneumonia.  Shortness of breath. EXAM: CHEST  2 VIEW COMPARISON:  06/22/2016. FINDINGS: Heart size normal. Progressive multifocal bilateral patchy infiltrates are noted.  No prominent pleural effusion or pneumothorax. Heart size normal. No acute bony abnormality . IMPRESSION: Progressive or recurrent multifocal bilateral patchy pulmonary infiltrates consistent with multifocal pneumonia. Close follow-up exams to demonstrate resolution. If there is no resolution contrast-enhanced CT of the chest suggested for further evaluation. Electronically Signed   By: Marcello Moores  Register   On:  07/24/2016 11:00   Ct Chest W Contrast  Result Date: 08/10/2016 CLINICAL DATA:  Chronic cough and syncope.  Initial encounter. EXAM: CT CHEST WITH CONTRAST TECHNIQUE: Multidetector CT imaging of the chest was performed during intravenous contrast administration. CONTRAST:  75 mL ISOVUE-300 IOPAMIDOL (ISOVUE-300) INJECTION 61% COMPARISON:  Chest radiograph performed 08/09/2016 FINDINGS: Cardiovascular: The heart remains normal in size. The thoracic aorta is grossly unremarkable in appearance. The great vessels are within normal limits. No calcific atherosclerotic disease is seen. Mediastinum/Nodes: Diffusely enlarged mediastinal and bilateral hilar nodes are seen, measuring up to 2.0 cm at the subcarinal region, and 2.2 cm at the right hilum. Enlarged right paratracheal, periaortic, aortopulmonary window and azygoesophageal recess nodes are seen. No pericardial effusion is identified. The visualized portions of the thyroid gland are unremarkable. No axillary lymphadenopathy is seen. Lungs/Pleura: Diffuse interstitial opacification is noted at the left lower lobe, and peripheral patchy airspace opacity is seen within the remainder of both lungs, with nodular airspace opacities at the left upper lobe. No pleural effusion or pneumothorax is seen. No dominant mass is identified. Upper Abdomen: The visualized portions of the liver and the spleen are unremarkable in appearance. The gallbladder, visualized portions of the pancreas and adrenal glands are unremarkable. The visualized portions of the kidneys are  within normal limits. Musculoskeletal: No acute osseous abnormalities are identified. The visualized musculature is unremarkable in appearance. IMPRESSION: 1. Diffuse interstitial opacification of the left lower lobe. Peripheral patchy airspace opacity within the remainder of both lungs, with nodular airspace opacities at the left upper lobe. This is an unusual appearance, and could reflect lymphangitic spread of tumor, given extensive mediastinal and bilateral hilar lymphadenopathy, or an atypical chronic infection, or some form of interstitial lung disease or pneumonitis. Would correlate for evidence of a primary malignancy; the mediastinal or hilar nodes would be amenable to biopsy, as deemed clinically appropriate. 2. Diffusely enlarged mediastinal and bilateral hilar nodes seen, measuring up to 2.0 cm at the subcarinal region, and 2.2 cm at the right hilum. Electronically Signed   By: Garald Balding M.D.   On: 08/10/2016 01:24   Dg Chest Port 1 View  Result Date: 08/11/2016 CLINICAL DATA:  Status post bronchoscopy. EXAM: PORTABLE CHEST 1 VIEW COMPARISON:  Radiographs of August 09, 2016. FINDINGS: Stable cardiomediastinal silhouette. No pneumothorax is noted. Stable diffuse left lung opacities and right midlung and basilar opacities compared to prior exam, most consistent with pneumonia. Bony thorax is unremarkable. IMPRESSION: Stable bilateral lung opacities as described above, most consistent with pneumonia. Electronically Signed   By: Marijo Conception, M.D.   On: 08/11/2016 15:06   Ct Maxillofacial Wo Contrast  Result Date: 08/10/2016 CLINICAL DATA:  Syncope. Hit face and mouth on concrete, with broken teeth. Initial encounter. EXAM: CT MAXILLOFACIAL WITHOUT CONTRAST TECHNIQUE: Multidetector CT imaging of the maxillofacial structures was performed. Multiplanar CT image reconstructions were also generated. A small metallic BB was placed on the right temple in order to reliably differentiate right  from left. COMPARISON:  None. FINDINGS: Osseous: There is a mildly displaced fracture through the central anterior edge of the maxilla, overlying the central maxillary incisors bilaterally, with mild anterior displacement of the central maxillary incisors. The maxilla and mandible appear intact. The nasal bone is unremarkable in appearance. The visualized dentition demonstrates no acute abnormality. Orbits: The orbits are intact bilaterally. Sinuses: Inspissated mucus is noted at the left maxillary sinus. The remaining visualized paranasal sinuses and mastoid air cells are well-aerated. Soft tissues: Soft tissue swelling  is noted overlying the fracture site. The parapharyngeal fat planes are preserved. The nasopharynx, oropharynx and hypopharynx are unremarkable in appearance. The visualized portions of the valleculae and piriform sinuses are grossly unremarkable.The parotid and submandibular glands are within normal limits. No cervical lymphadenopathy is seen. Limited intracranial: Prominence of the ventricles raises question for mild cortical volume loss. IMPRESSION: 1. Mildly displaced fracture through the central anterior edge of the maxilla, overlying the central maxillary incisors bilaterally, with mild anterior displacement of the central maxillary incisors. 2. Soft tissue swelling overlying the fracture site. 3. Inspissated mucus at the left maxillary sinus. Electronically Signed   By: Garald Balding M.D.   On: 08/10/2016 00:59   Dg C-arm Bronchoscopy  Result Date: 08/11/2016 CLINICAL DATA:  C-ARM BRONCHOSCOPY Fluoroscopy was utilized by the requesting physician.  No radiographic interpretation.     CBC  Recent Labs Lab 08/09/16 1952 08/12/16 0333  WBC 6.1 13.1*  HGB 12.7* 12.1*  HCT 39.2 37.0*  PLT 381 381  MCV 79.4 78.6  MCH 25.7* 25.7*  MCHC 32.4 32.7  RDW 13.5 13.3  LYMPHSABS 1.4  --   MONOABS 0.9  --   EOSABS 0.3  --   BASOSABS 0.0  --     Chemistries   Recent Labs Lab  08/09/16 1952 08/12/16 0333  NA 138 136  K 3.7 3.8  CL 102 105  CO2 25 24  GLUCOSE 140* 124*  BUN 13 18  CREATININE 1.12 0.96  CALCIUM 9.7 8.8*  AST 24 22  ALT 21 19  ALKPHOS 65 57  BILITOT 0.5 0.3   ------------------------------------------------------------------------------------------------------------------ estimated creatinine clearance is 111.1 mL/min (by C-G formula based on SCr of 0.96 mg/dL). ------------------------------------------------------------------------------------------------------------------ No results for input(s): HGBA1C in the last 72 hours. ------------------------------------------------------------------------------------------------------------------ No results for input(s): CHOL, HDL, LDLCALC, TRIG, CHOLHDL, LDLDIRECT in the last 72 hours. ------------------------------------------------------------------------------------------------------------------ No results for input(s): TSH, T4TOTAL, T3FREE, THYROIDAB in the last 72 hours.  Invalid input(s): FREET3 ------------------------------------------------------------------------------------------------------------------ No results for input(s): VITAMINB12, FOLATE, FERRITIN, TIBC, IRON, RETICCTPCT in the last 72 hours.  Coagulation profile  Recent Labs Lab 08/10/16 0413  INR 1.07    No results for input(s): DDIMER in the last 72 hours.  Cardiac Enzymes No results for input(s): CKMB, TROPONINI, MYOGLOBIN in the last 168 hours.  Invalid input(s): CK ------------------------------------------------------------------------------------------------------------------ Invalid input(s): POCBNP   CBG: No results for input(s): GLUCAP in the last 168 hours.     Studies: Dg Chest Port 1 View  Result Date: 08/11/2016 CLINICAL DATA:  Status post bronchoscopy. EXAM: PORTABLE CHEST 1 VIEW COMPARISON:  Radiographs of August 09, 2016. FINDINGS: Stable cardiomediastinal silhouette. No pneumothorax  is noted. Stable diffuse left lung opacities and right midlung and basilar opacities compared to prior exam, most consistent with pneumonia. Bony thorax is unremarkable. IMPRESSION: Stable bilateral lung opacities as described above, most consistent with pneumonia. Electronically Signed   By: Marijo Conception, M.D.   On: 08/11/2016 15:06   Dg C-arm Bronchoscopy  Result Date: 08/11/2016 CLINICAL DATA:  C-ARM BRONCHOSCOPY Fluoroscopy was utilized by the requesting physician.  No radiographic interpretation.      No results found for: HGBA1C Lab Results  Component Value Date   CREATININE 0.96 08/12/2016       Scheduled Meds: . azithromycin  250 mg Oral Daily  . dextromethorphan-guaiFENesin  1 tablet Oral BID  . enoxaparin (LOVENOX) injection  40 mg Subcutaneous Q24H  . Influenza vac split quadrivalent PF  0.5 mL Intramuscular Tomorrow-1000  . methylPREDNISolone (SOLU-MEDROL) injection  60 mg Intravenous Q12H   Continuous Infusions: . sodium chloride 100 mL/hr at 08/13/16 1054     LOS: 3 days    Time spent: >30 MINS    [facility] Pager [phone]. If 7PM-7AM, please contact night-coverage at [email or web address], password [facility] 08/13/2016, 1:05 PM  LOS: 3 days

## 2016-08-13 NOTE — Progress Notes (Signed)
  Echocardiogram 2D Echocardiogram has been performed.  Noah Roach 08/13/2016, 6:06 PM

## 2016-08-14 ENCOUNTER — Encounter (HOSPITAL_COMMUNITY): Payer: Self-pay | Admitting: Pulmonary Disease

## 2016-08-14 LAB — ECHOCARDIOGRAM COMPLETE
CHL CUP TV REG PEAK VELOCITY: 225 cm/s
E decel time: 197 msec
E/e' ratio: 4.81
FS: 32 % (ref 28–44)
Height: 68 in
IV/PV OW: 0.87
LA ID, A-P, ES: 33 mm
LA diam index: 1.47 cm/m2
LA vol index: 23.8 mL/m2
LA vol: 53.5 mL
LAVOLA4C: 47.1 mL
LDCA: 3.8 cm2
LEFT ATRIUM END SYS DIAM: 33 mm
LV E/e' medial: 4.81
LV E/e'average: 4.81
LV PW d: 10.3 mm — AB (ref 0.6–1.1)
LV TDI E'LATERAL: 12.3
LVELAT: 12.3 cm/s
LVOTD: 22 mm
MV Dec: 197
MV pk A vel: 56.3 m/s
MV pk E vel: 59.2 m/s
RV LATERAL S' VELOCITY: 12.3 cm/s
RV sys press: 23 mmHg
TAPSE: 22.2 mm
TDI e' medial: 6.09
TRMAXVEL: 225 cm/s
Weight: 3584 oz

## 2016-08-14 LAB — PNEUMOCYSTIS JIROVECI SMEAR BY DFA: Pneumocystis jiroveci Ag: NEGATIVE

## 2016-08-14 MED ORDER — PREDNISONE 20 MG PO TABS
20.0000 mg | ORAL_TABLET | Freq: Two times a day (BID) | ORAL | Status: DC
  Administered 2016-08-15: 20 mg via ORAL
  Filled 2016-08-14: qty 1

## 2016-08-14 NOTE — Progress Notes (Signed)
SATURATION QUALIFICATIONS: (This note is used to comply with regulatory documentation for home oxygen)  Patient Saturations on Room Air at Rest = 100%  Patient Saturations on Room Air while Ambulating = 91-98%   Please briefly explain why patient needs home oxygen: No indications noted.

## 2016-08-14 NOTE — Progress Notes (Addendum)
PULMONARY / CRITICAL CARE MEDICINE   Name: Ka Polishchuk MRN: [id number] DOB: 02/27/1971    ADMISSION DATE:  08/09/2016 CONSULTATION DATE: 08/10/2016  REFERRING MD:  Dr. Charlies Silvers, [facility]  CHIEF COMPLAINT:  Short of breath  SUBJECTIVE:  45 yo male with progressive cough, dyspnea and persistent b/l pulmonary infiltrates with mediastinal and hilar lymphadenopathy.  He has not responded to several courses of outpatient antibiotics and prednisone.  He reports his brother died recently with similar symptoms.  INTERVAL Feeling OK at rest, describes that most symptoms come with ambulation and exertion.Frustrated that he doesn't have answers.  VITAL SIGNS: BP 129/86 (BP Location: Left Arm)   Pulse 68   Temp 97.8 F (36.6 C) (Oral)   Resp 18   Ht 5\' 8"  (1.727 m)   Wt 101.6 kg (224 lb)   SpO2 97%   BMI 34.06 kg/m   INTAKE / OUTPUT: I/O last 3 completed shifts: In: 720 [P.O.:720] Out: 7350 [Urine:7350]  PHYSICAL EXAMINATION:  General:  Alert, frustrated Neuro:  Normal strength HEENT:  Scant rales L>R Cardiovascular:  Regular, no murmur Lungs:  B/l crackles, better air movement Abdomen:  Soft, non tender Musculoskeletal:  No edema Skin:  No rashes  LABS: CMP Latest Ref Rng & Units 08/12/2016 08/09/2016 06/22/2016  Glucose 65 - 99 mg/dL 124(H) 140(H) 104(H)  BUN 6 - 20 mg/dL 18 13 10   Creatinine 0.61 - 1.24 mg/dL 0.96 1.12 1.08  Sodium 135 - 145 mmol/L 136 138 137  Potassium 3.5 - 5.1 mmol/L 3.8 3.7 3.8  Chloride 101 - 111 mmol/L 105 102 106  CO2 22 - 32 mmol/L 24 25 21(L)  Calcium 8.9 - 10.3 mg/dL 8.8(L) 9.7 9.1  Total Protein 6.5 - 8.1 g/dL 6.4(L) 7.5 -  Total Bilirubin 0.3 - 1.2 mg/dL 0.3 0.5 -  Alkaline Phos 38 - 126 U/L 57 65 -  AST 15 - 41 U/L 22 24 -  ALT 17 - 63 U/L 19 21 -    CBC Latest Ref Rng & Units 08/12/2016 08/09/2016 06/22/2016  WBC 4.0 - 10.5 K/uL 13.1(H) 6.1 5.6  Hemoglobin 13.0 - 17.0 g/dL 12.1(L) 12.7(L) 13.3  Hematocrit 39.0 - 52.0 % 37.0(L) 39.2  41.5  Platelets 150 - 400 K/uL 381 381 215    IMAGING: No results found.   STUDIES:  CT chest 10/19 >> b/l hilar and mediastinal LAN up to 2.2 cm, diffuse interstitial ASD LLL, patchy peripheral ASD b/l, nodular ASD LUL Serology 10/20 >>  Bronchoscopy 10/20 >>  CULTURES: Blood 10/19 >> Respiratory viral panel 10/19 >> Rhinovirus Pneumococcal Ag 10/19 >> negative Legionella Ag 10/19 >> negative BAL RUL 10/20 >> Normal flora BAL LUL 10/20 >>Normal flora AFB BAL 10/20 >> Negative Fungal BAL 10/20 >> PCP BAL 10/20 >> Negative  ANTIBIOTICS: Zithromax 10/19 >>  SIGNIFICANT EVENTS: 10/19 Admit  LINES/TUBES:  DISCUSSION: 45 yo male with progressive cough, dyspnea and persistent b/l pulmonary infiltrates with mediastinal and hilar lymphadenopathy.  He has not responded to several courses of outpatient antibiotics and prednisone.  He reports his brother died recently with similar symptoms.  Differential at this time includes atypical infection, inflammatory process, or malignancy.  Rhinovirus positive on respiratory PCR 10/19.  ANA positive from 10/20 with spindle apparatus pattern. So far the rest has been negative.  ASSESSMENT / PLAN:  Pulmonary infiltrates with mediastinal/hilar LAN. - f/u bronchoscopy and serology results from 10/20 as they become available - continue zithromax and solumedrol - continue Abx for now - may eventually need  lung biopsy - Ambulate - Incentive spirometry  Georgann Housekeeper, AGACNP-BC [place] Pager [phone] or [phone]  08/14/2016 11:20 AM   STAFF NOTE: Linwood Dibbles, MD FACP have personally reviewed patient's available data, including medical history, events of note, physical examination and test results as part of my evaluation. I have discussed with resident/NP and other care providers such as pharmacist, RN and RRT. In addition, I personally evaluated patient and elicited key findings of: awake,  was in shower, after done shower re examined pt, slight coarse BS, no distress, PATH back and reviewed non necrotizing granuloma, diff dx sarcoid, INfectious ( mycobact), autoimmune (less likely), given wold change steroids to pred 40 mg bid and dc home to see DR Halford Chessman in follow up, we await further stains from path from bx also, I also note TB neg from BAL, consider ACEI level  Lavon Paganini. Titus Mould, MD, [place] Pgr: [facility] 08/14/2016 6:04 PM

## 2016-08-14 NOTE — Progress Notes (Signed)
[facility] PROGRESS NOTE  Noah Roach KX:[id number] DOB: 1971/02/04 DOA: 08/09/2016   PCP: PROVIDER NOT IN SYSTEM     Assessment/Plan: Principal Problem:   Cough Active Problems:   Recurrent pneumonia   Syncope   Fall   Pulmonary infiltrates   45 year old male without significant past medical history. He presented to [facility] with worsening cough and shortness of breath for past 3 months prior to this admission. He was seen initially in July 2017 for his symptoms, he initially improved, then he went to [place] and after that his cough returned. In August 2017 he was diagnosed with pneumonia and he was started on Levaquin. He finished 5 day course of antibiotic but his symptoms persisted. He was again seen in the clinic and treated with another antibiotic for 5 days with no significant improvement. On this admission, CXR showed bilateral persistent pulmonary infiltrates consistent with pneumonia. CT chest with contrast showed diffuse interstitial opacification of the left lower lobe and patchy airspace opacity in both lungs with nodular airspace opacities at the left upper lobe which could reflect lymphangitic spread of the tumor given extensive mediastinal and bilateral hilar lymphadenopathy or an atypical chronic infection  Assessment and plan Intractable cough/pulmonary infiltrates/hilar lymphadenopathy Respiratory virus panel positive for rhinovirus However this is chronic, He has not responded to several courses of outpatient antibiotics and prednisone.  He reports his brother died recently with similar symptoms. Differential at this time includes atypical infection, inflammatory process, or malignancy Serum   Serologies  Essentially negative  Status post Bronchoscopy 10/20, Gram stain consistent with gram-negative rods, gram-positive cocci in pairs Consistent with normal respiratory flora.  if negative may need VATS- /lung biopsy as per pccm note from  10/23 Pulmonary following Continue antibiotics and steroids    Syncope and fall: Etiology is not clear. May be due to vasovagal syncope secondary to severe cough. CT maxillofacial showed mildly displaced fracture maxilla. Patient has very low risk of stroke. No focal neurological findings on physical examination, less likely to have stroke. -check orthostatic vital signs  Patient could have vasovagal syncope related to his cough 2-D echo  shows normal EF 55-60% with grade 1 diastolic dysfunction, no wall motion abnormalities.      DVT prophylaxsis Lovenox  Code Status:  Full code     Family Communication: Discussed in detail with the patient, all imaging results, lab results explained to the patient   Disposition Plan: As per pulmonary       Consultants:  Pulmonary  Procedures:  Bronchoscopy  Antibiotics: Anti-infectives    Start     Dose/Rate Route Frequency Ordered Stop   08/11/16 1000  azithromycin (ZITHROMAX) tablet 250 mg     250 mg Oral Daily 08/10/16 0236 08/14/16 0942   08/10/16 1000  azithromycin (ZITHROMAX) tablet 500 mg     500 mg Oral Daily 08/10/16 0236 08/10/16 1041   08/09/16 2330  vancomycin (VANCOCIN) 1,500 mg in sodium chloride 0.9 % 500 mL IVPB     1,500 mg 250 mL/hr over 120 Minutes Intravenous  Once 08/09/16 2325 08/10/16 0302   08/09/16 2330  ceFEPIme (MAXIPIME) 2 g in dextrose 5 % 50 mL IVPB     2 g 100 mL/hr over 30 Minutes Intravenous  Once 08/09/16 2325 08/10/16 0059         HPI/Subjective: Slight cough, But overall improved, slight dyspnea on exertion     Objective: Vitals:   08/12/16 2042 08/13/16 0527 08/13/16 1937 08/14/16  0625  BP: 126/76 115/78 136/85 129/86  Pulse: 84 79 61 68  Resp: 18 18 18 18   Temp: 98.7 F (37.1 C) 98.5 F (36.9 C) 98.5 F (36.9 C) 97.8 F (36.6 C)  TempSrc: Oral Oral Oral Oral  SpO2: 92% 94% 98% 97%  Weight:      Height:        Intake/Output Summary (Last 24 hours) at 08/14/16  1330 Last data filed at 08/14/16 [id number]  Gross per 24 hour  Intake              240 ml  Output             3275 ml  Net            -3035 ml    Exam:  Examination:  General exam: Appears calm and comfortable  Respiratory system: Clear to auscultation. Respiratory effort normal. Cardiovascular system: S1 & S2 heard, RRR. No JVD, murmurs, rubs, gallops or clicks. No pedal edema. Gastrointestinal system: Abdomen is nondistended, soft and nontender. No organomegaly or masses felt. Normal bowel sounds heard. Central nervous system: Alert and oriented. No focal neurological deficits. Extremities: Symmetric 5 x 5 power. Skin: No rashes, lesions or ulcers Psychiatry: Judgement and insight appear normal. Mood & affect appropriate.     Data Reviewed: I have personally reviewed following labs and imaging studies  Micro Results Recent Results (from the past 240 hour(s))  Culture, blood (Routine X 2) w Reflex to ID Panel     Status: None (Preliminary result)   Collection Time: 08/10/16  1:05 AM  Result Value Ref Range Status   Specimen Description BLOOD LEFT ARM  Final   Special Requests BOTTLES DRAWN AEROBIC AND ANAEROBIC 5ML  Final   Culture NO GROWTH 3 DAYS  Final   Report Status PENDING  Incomplete  Culture, blood (Routine X 2) w Reflex to ID Panel     Status: None (Preliminary result)   Collection Time: 08/10/16  1:10 AM  Result Value Ref Range Status   Specimen Description BLOOD LEFT HAND  Final   Special Requests BOTTLES DRAWN AEROBIC AND ANAEROBIC 5ML  Final   Culture NO GROWTH 3 DAYS  Final   Report Status PENDING  Incomplete  Respiratory Panel by PCR     Status: Abnormal   Collection Time: 08/10/16  2:35 AM  Result Value Ref Range Status   Adenovirus NOT DETECTED NOT DETECTED Final   Coronavirus 229E NOT DETECTED NOT DETECTED Final   Coronavirus HKU1 NOT DETECTED NOT DETECTED Final   Coronavirus NL63 NOT DETECTED NOT DETECTED Final   Coronavirus OC43 NOT DETECTED NOT  DETECTED Final   Metapneumovirus NOT DETECTED NOT DETECTED Final   Rhinovirus / Enterovirus DETECTED (A) NOT DETECTED Final   Influenza A NOT DETECTED NOT DETECTED Final   Influenza B NOT DETECTED NOT DETECTED Final   Parainfluenza Virus 1 NOT DETECTED NOT DETECTED Final   Parainfluenza Virus 2 NOT DETECTED NOT DETECTED Final   Parainfluenza Virus 3 NOT DETECTED NOT DETECTED Final   Parainfluenza Virus 4 NOT DETECTED NOT DETECTED Final   Respiratory Syncytial Virus NOT DETECTED NOT DETECTED Final   Bordetella pertussis NOT DETECTED NOT DETECTED Final   Chlamydophila pneumoniae NOT DETECTED NOT DETECTED Final   Mycoplasma pneumoniae NOT DETECTED NOT DETECTED Final  Acid Fast Smear (AFB)     Status: None   Collection Time: 08/11/16  2:37 PM  Result Value Ref Range Status   AFB Specimen Processing Concentration  Final   Acid Fast Smear Negative  Final    Comment: (NOTE) Performed At: [facility] [place], [place] [id number] Lindon Romp MD [id number]    Source (AFB) BRONCHIAL ALVEOLAR LAVAGE  Final    Comment: LEFT UPPER LOBE  Culture, fungus without smear     Status: None (Preliminary result)   Collection Time: 08/11/16  2:37 PM  Result Value Ref Range Status   Specimen Description BRONCHIAL ALVEOLAR LAVAGE  Final   Special Requests LEFT UPPER LOBE  Final   Culture NO FUNGUS ISOLATED AFTER 3 DAYS  Final   Report Status PENDING  Incomplete  Culture, respiratory (NON-Expectorated)     Status: None   Collection Time: 08/11/16  2:37 PM  Result Value Ref Range Status   Specimen Description BRONCHIAL ALVEOLAR LAVAGE  Final   Special Requests LEFT UPPER LOBE  Final   Gram Stain   Final    MODERATE WBC PRESENT, PREDOMINANTLY MONONUCLEAR RARE GRAM NEGATIVE RODS RARE GRAM POSITIVE COCCI IN PAIRS    Culture Consistent with normal respiratory flora.  Final   Report Status 08/13/2016 FINAL  Final  Acid Fast Smear (AFB)     Status: None   Collection Time:  08/11/16  2:40 PM  Result Value Ref Range Status   AFB Specimen Processing Concentration  Final   Acid Fast Smear Negative  Final    Comment: (NOTE) Performed At: [facility] [address] [place], [place] [id number] Lindon Romp MD [id number]    Source (AFB) BRONCHIAL ALVEOLAR LAVAGE  Final    Comment: RIGHT UPPER LOBE  Culture, fungus without smear     Status: None (Preliminary result)   Collection Time: 08/11/16  2:40 PM  Result Value Ref Range Status   Specimen Description BRONCHIAL ALVEOLAR LAVAGE  Final   Special Requests RIGHT UPPER LOBE  Final   Culture NO FUNGUS ISOLATED AFTER 2 DAYS  Final   Report Status PENDING  Incomplete  Pneumocystis smear by DFA     Status: None   Collection Time: 08/11/16  2:40 PM  Result Value Ref Range Status   Specimen Source-[facility] BRONCHIAL ALVEOLAR LAVAGE  Final    Comment: RIGHT UPPER LOBE   Pneumocystis jiroveci Ag NEGATIVE  Final    Comment: Performed at [facility]  Culture, respiratory (NON-Expectorated)     Status: None   Collection Time: 08/11/16  2:40 PM  Result Value Ref Range Status   Specimen Description BRONCHIAL ALVEOLAR LAVAGE  Final   Special Requests RIGHT UPPER LOBE  Final   Gram Stain   Final    RARE WBC PRESENT,BOTH PMN AND MONONUCLEAR FEW GRAM NEGATIVE RODS FEW GRAM POSITIVE COCCI IN PAIRS    Culture Consistent with normal respiratory flora.  Final   Report Status 08/13/2016 FINAL  Final    Radiology Reports Dg Chest 2 View  Result Date: 08/09/2016 CLINICAL DATA:  Pneumonia last month, persistent cough and shortness of breath EXAM: CHEST  2 VIEW COMPARISON:  07/24/2016 FINDINGS: Normal heart size, mediastinal contours, and pulmonary vascularity. Persistent pulmonary infiltrates diffusely throughout LEFT lung and at mid to lower RIGHT lung. Probable component of atelectasis at minor fissure. No definite pleural effusion or pneumothorax. IMPRESSION: Persistent BILATERAL pulmonary  infiltrates consistent with pneumonia, probably slightly increased on LEFT since previous exam. Electronically Signed   By: Lavonia Dana M.D.   On: 08/09/2016 20:59   Dg Chest 2 View  Result Date: 07/24/2016 CLINICAL DATA:  Pneumonia.  Shortness of breath. EXAM: CHEST  2 VIEW COMPARISON:  06/22/2016. FINDINGS: Heart size normal. Progressive multifocal bilateral patchy infiltrates are noted. No prominent pleural effusion or pneumothorax. Heart size normal. No acute bony abnormality . IMPRESSION: Progressive or recurrent multifocal bilateral patchy pulmonary infiltrates consistent with multifocal pneumonia. Close follow-up exams to demonstrate resolution. If there is no resolution contrast-enhanced CT of the chest suggested for further evaluation. Electronically Signed   By: Marcello Moores  Register   On: 07/24/2016 11:00   Ct Chest W Contrast  Result Date: 08/10/2016 CLINICAL DATA:  Chronic cough and syncope.  Initial encounter. EXAM: CT CHEST WITH CONTRAST TECHNIQUE: Multidetector CT imaging of the chest was performed during intravenous contrast administration. CONTRAST:  75 mL ISOVUE-300 IOPAMIDOL (ISOVUE-300) INJECTION 61% COMPARISON:  Chest radiograph performed 08/09/2016 FINDINGS: Cardiovascular: The heart remains normal in size. The thoracic aorta is grossly unremarkable in appearance. The great vessels are within normal limits. No calcific atherosclerotic disease is seen. Mediastinum/Nodes: Diffusely enlarged mediastinal and bilateral hilar nodes are seen, measuring up to 2.0 cm at the subcarinal region, and 2.2 cm at the right hilum. Enlarged right paratracheal, periaortic, aortopulmonary window and azygoesophageal recess nodes are seen. No pericardial effusion is identified. The visualized portions of the thyroid gland are unremarkable. No axillary lymphadenopathy is seen. Lungs/Pleura: Diffuse interstitial opacification is noted at the left lower lobe, and peripheral patchy airspace opacity is seen within  the remainder of both lungs, with nodular airspace opacities at the left upper lobe. No pleural effusion or pneumothorax is seen. No dominant mass is identified. Upper Abdomen: The visualized portions of the liver and the spleen are unremarkable in appearance. The gallbladder, visualized portions of the pancreas and adrenal glands are unremarkable. The visualized portions of the kidneys are within normal limits. Musculoskeletal: No acute osseous abnormalities are identified. The visualized musculature is unremarkable in appearance. IMPRESSION: 1. Diffuse interstitial opacification of the left lower lobe. Peripheral patchy airspace opacity within the remainder of both lungs, with nodular airspace opacities at the left upper lobe. This is an unusual appearance, and could reflect lymphangitic spread of tumor, given extensive mediastinal and bilateral hilar lymphadenopathy, or an atypical chronic infection, or some form of interstitial lung disease or pneumonitis. Would correlate for evidence of a primary malignancy; the mediastinal or hilar nodes would be amenable to biopsy, as deemed clinically appropriate. 2. Diffusely enlarged mediastinal and bilateral hilar nodes seen, measuring up to 2.0 cm at the subcarinal region, and 2.2 cm at the right hilum. Electronically Signed   By: Garald Balding M.D.   On: 08/10/2016 01:24   Dg Chest Port 1 View  Result Date: 08/11/2016 CLINICAL DATA:  Status post bronchoscopy. EXAM: PORTABLE CHEST 1 VIEW COMPARISON:  Radiographs of August 09, 2016. FINDINGS: Stable cardiomediastinal silhouette. No pneumothorax is noted. Stable diffuse left lung opacities and right midlung and basilar opacities compared to prior exam, most consistent with pneumonia. Bony thorax is unremarkable. IMPRESSION: Stable bilateral lung opacities as described above, most consistent with pneumonia. Electronically Signed   By: Marijo Conception, M.D.   On: 08/11/2016 15:06   Ct Maxillofacial Wo  Contrast  Result Date: 08/10/2016 CLINICAL DATA:  Syncope. Hit face and mouth on concrete, with broken teeth. Initial encounter. EXAM: CT MAXILLOFACIAL WITHOUT CONTRAST TECHNIQUE: Multidetector CT imaging of the maxillofacial structures was performed. Multiplanar CT image reconstructions were also generated. A small metallic BB was placed on the right temple in order to reliably differentiate right from left. COMPARISON:  None. FINDINGS:  Osseous: There is a mildly displaced fracture through the central anterior edge of the maxilla, overlying the central maxillary incisors bilaterally, with mild anterior displacement of the central maxillary incisors. The maxilla and mandible appear intact. The nasal bone is unremarkable in appearance. The visualized dentition demonstrates no acute abnormality. Orbits: The orbits are intact bilaterally. Sinuses: Inspissated mucus is noted at the left maxillary sinus. The remaining visualized paranasal sinuses and mastoid air cells are well-aerated. Soft tissues: Soft tissue swelling is noted overlying the fracture site. The parapharyngeal fat planes are preserved. The nasopharynx, oropharynx and hypopharynx are unremarkable in appearance. The visualized portions of the valleculae and piriform sinuses are grossly unremarkable.The parotid and submandibular glands are within normal limits. No cervical lymphadenopathy is seen. Limited intracranial: Prominence of the ventricles raises question for mild cortical volume loss. IMPRESSION: 1. Mildly displaced fracture through the central anterior edge of the maxilla, overlying the central maxillary incisors bilaterally, with mild anterior displacement of the central maxillary incisors. 2. Soft tissue swelling overlying the fracture site. 3. Inspissated mucus at the left maxillary sinus. Electronically Signed   By: Garald Balding M.D.   On: 08/10/2016 00:59   Dg C-arm Bronchoscopy  Result Date: 08/11/2016 CLINICAL DATA:  C-ARM  BRONCHOSCOPY Fluoroscopy was utilized by the requesting physician.  No radiographic interpretation.     CBC  Recent Labs Lab 08/09/16 1952 08/12/16 0333  WBC 6.1 13.1*  HGB 12.7* 12.1*  HCT 39.2 37.0*  PLT 381 381  MCV 79.4 78.6  MCH 25.7* 25.7*  MCHC 32.4 32.7  RDW 13.5 13.3  LYMPHSABS 1.4  --   MONOABS 0.9  --   EOSABS 0.3  --   BASOSABS 0.0  --     Chemistries   Recent Labs Lab 08/09/16 1952 08/12/16 0333  NA 138 136  K 3.7 3.8  CL 102 105  CO2 25 24  GLUCOSE 140* 124*  BUN 13 18  CREATININE 1.12 0.96  CALCIUM 9.7 8.8*  AST 24 22  ALT 21 19  ALKPHOS 65 57  BILITOT 0.5 0.3   ------------------------------------------------------------------------------------------------------------------ estimated creatinine clearance is 111.1 mL/min (by C-G formula based on SCr of 0.96 mg/dL). ------------------------------------------------------------------------------------------------------------------ No results for input(s): HGBA1C in the last 72 hours. ------------------------------------------------------------------------------------------------------------------ No results for input(s): CHOL, HDL, LDLCALC, TRIG, CHOLHDL, LDLDIRECT in the last 72 hours. ------------------------------------------------------------------------------------------------------------------ No results for input(s): TSH, T4TOTAL, T3FREE, THYROIDAB in the last 72 hours.  Invalid input(s): FREET3 ------------------------------------------------------------------------------------------------------------------ No results for input(s): VITAMINB12, FOLATE, FERRITIN, TIBC, IRON, RETICCTPCT in the last 72 hours.  Coagulation profile  Recent Labs Lab 08/10/16 0413  INR 1.07    No results for input(s): DDIMER in the last 72 hours.  Cardiac Enzymes No results for input(s): CKMB, TROPONINI, MYOGLOBIN in the last 168 hours.  Invalid input(s):  CK ------------------------------------------------------------------------------------------------------------------ Invalid input(s): POCBNP   CBG: No results for input(s): GLUCAP in the last 168 hours.     Studies: No results found.    No results found for: HGBA1C Lab Results  Component Value Date   CREATININE 0.96 08/12/2016       Scheduled Meds: . dextromethorphan-guaiFENesin  1 tablet Oral BID  . enoxaparin (LOVENOX) injection  40 mg Subcutaneous Q24H  . Influenza vac split quadrivalent PF  0.5 mL Intramuscular Tomorrow-1000  . methylPREDNISolone (SOLU-MEDROL) injection  60 mg Intravenous Q12H   Continuous Infusions: . sodium chloride 100 mL/hr at 08/13/16 1054     LOS: 4 days    Time spent: >30 MINS    [facility]  [facility] Pager [phone]. If 7PM-7AM, please contact night-coverage at [email or web address], password [facility] 08/14/2016, 1:30 PM  LOS: 4 days

## 2016-08-15 LAB — CULTURE, BLOOD (ROUTINE X 2)
CULTURE: NO GROWTH
Culture: NO GROWTH

## 2016-08-15 MED ORDER — PREDNISONE 20 MG PO TABS: 40.0000 mg | ORAL_TABLET | Freq: Two times a day (BID) | ORAL | 1 refills | Status: DC

## 2016-08-15 MED ORDER — DM-GUAIFENESIN ER 30-600 MG PO TB12: 1.0000 | ORAL_TABLET | Freq: Two times a day (BID) | ORAL | 0 refills | Status: DC

## 2016-08-15 NOTE — Progress Notes (Signed)
Pt discharging home.  All instructions given and reviewed, follow up appts in place, flu shot given along with VIS.

## 2016-08-15 NOTE — Progress Notes (Signed)
PULMONARY / CRITICAL CARE MEDICINE   Name: Noah Roach MRN: [id number] DOB: [birth year]    ADMISSION DATE:  08/09/2016 CONSULTATION DATE: 08/10/2016  REFERRING MD:  Dr. Charlies Silvers, [facility]  CHIEF COMPLAINT:  Short of breath  SUBJECTIVE:  45 yo male with progressive cough, dyspnea and persistent b/l pulmonary infiltrates with mediastinal and hilar lymphadenopathy.  He has not responded to several courses of outpatient antibiotics and prednisone.  He reports his brother died recently with similar symptoms.  INTERVAL Feeling OK at rest, describes that most symptoms come with ambulation and exertion.Frustrated that he doesn't have answers.He states he has not been ambulating.  VITAL SIGNS: BP 121/79 (BP Location: Left Arm)   Pulse 69   Temp 97.3 F (36.3 C) (Oral)   Resp 18   Ht 5\' 8"  (1.727 m)   Wt 224 lb (101.6 kg)   SpO2 97%   BMI 34.06 kg/m   INTAKE / OUTPUT: I/O last 3 completed shifts: In: -  Out: 3275 [Urine:3275]  PHYSICAL EXAMINATION:  General:  Alert, laying in bed Neuro:  Normal strength HEENT:  Scant rales L>R Cardiovascular:  Regular, no murmur Lungs:  B/l crackles, better air movement Abdomen:  Soft, non tender Musculoskeletal:  No edema Skin:  No rashes  LABS: CMP Latest Ref Rng & Units 08/12/2016 08/09/2016 06/22/2016  Glucose 65 - 99 mg/dL 124(H) 140(H) 104(H)  BUN 6 - 20 mg/dL 18 13 10   Creatinine 0.61 - 1.24 mg/dL 0.96 1.12 1.08  Sodium 135 - 145 mmol/L 136 138 137  Potassium 3.5 - 5.1 mmol/L 3.8 3.7 3.8  Chloride 101 - 111 mmol/L 105 102 106  CO2 22 - 32 mmol/L 24 25 21(L)  Calcium 8.9 - 10.3 mg/dL 8.8(L) 9.7 9.1  Total Protein 6.5 - 8.1 g/dL 6.4(L) 7.5 -  Total Bilirubin 0.3 - 1.2 mg/dL 0.3 0.5 -  Alkaline Phos 38 - 126 U/L 57 65 -  AST 15 - 41 U/L 22 24 -  ALT 17 - 63 U/L 19 21 -    CBC Latest Ref Rng & Units 08/12/2016 08/09/2016 06/22/2016  WBC 4.0 - 10.5 K/uL 13.1(H) 6.1 5.6  Hemoglobin 13.0 - 17.0 g/dL 12.1(L) 12.7(L) 13.3   Hematocrit 39.0 - 52.0 % 37.0(L) 39.2 41.5  Platelets 150 - 400 K/uL 381 381 215    IMAGING: No results found.   STUDIES:  CT chest 10/19 >> b/l hilar and mediastinal LAN up to 2.2 cm, diffuse interstitial ASD LLL, patchy peripheral ASD b/l, nodular ASD LUL Serology 10/20 >>  Bronchoscopy 10/20 >> See Below  Diagnosis Bronchus, biopsy, Left Lower Lobe - NON-NECROTIZING GRANULOMAS. - NO EVIDENCE OF MALIGNANCY. - SEE MICROSCOPIC DESCRIPTION.  Microscopic Comment There are fragments of bronchial mucosa one of which shows non-necrotizing granulomas including multinucleated giant cells. The differential includes sarcoid and infectious granulomas. Special stains will be performed and reported as an addendum. No polarizable foreign material is identified. (JDP:ecj 08/14/2016)  ADDENDUM: No acid fast Bacilli are identified with AFB stain, and no fungi identified are identified with GMS or PAS stains. (JDP:ds 08/15/16)  CULTURES: Blood 10/19 >> Respiratory viral panel 10/19 >> Rhinovirus Pneumococcal Ag 10/19 >> negative Legionella Ag 10/19 >> negative BAL RUL 10/20 >> Normal flora BAL LUL 10/20 >>Normal flora AFB BAL 10/20 >> Negative Fungal BAL 10/20 >> Negative PCP BAL 10/20 >> Negative  ANTIBIOTICS: Zithromax 10/19 >>  SIGNIFICANT EVENTS: 10/19 Admit 10/20 Bronchoscopy  LINES/TUBES:  DISCUSSION: 45 yo male with progressive cough, dyspnea and persistent b/l pulmonary  infiltrates with mediastinal and hilar lymphadenopathy.  He has not responded to several courses of outpatient antibiotics and prednisone.  He reports his brother died recently with similar symptoms.  Differential at this time includes atypical infection, inflammatory process, or malignancy.  Rhinovirus positive on respiratory PCR 10/19.  ANA positive from 10/20 with spindle apparatus pattern. So far the rest has been negative. Biopsy 10/20 indicates non-necrotizing granulomas without evidence of  malignancy. Consider in differential diagnosis: sarcoid and infectious granulomas/ auto immune.  ASSESSMENT / PLAN: For Discharge home 10/24 by primary team.  Pulmonary infiltrates with mediastinal/hilar LAN. - Bronch results from 10/ 20 noted.Discussed with patient - Follow up additional stains from biopsy/path - continue zithromax and solumedrol - Consider transition to po prednisone 40 mg BID per Dr. Janit Pagan note 10/23 - continue Abx , transition to po for discharge - may eventually need lung biopsy - Ambulate as tolerated - Incentive spirometry - Will need out patient pulmonary follow up with Dr. Halford Chessman after hospital follow up. [facility] Follow Up Scheduled for 08/28/2016 at 3 pm with Rexene Edison, NP) - Consider ACEI level as part of follow up labwork.  Eric Form,  AGACNP-BC [facility] Pager  [phone]  08/15/2016 11:22 AM

## 2016-08-15 NOTE — Care Management Note (Signed)
Case Management Note  Patient Details  Name: Noah Roach MRN: [id number] Date of Birth: [birth year]  Subjective/Objective:      persistent b/l pulmonary infiltrates with mediastinal and hilar lymphadenopathy              Action/Plan: Discharge Planning: AVS reviewed: NCM spoke to pt at bedside. Pt states he works full-time and can afford Prednisone. States he does not need note for work. Pt goes to the [facility] in [place]. Will schedule appt with his PCP.   PCP- Mabeline Caras MD  Expected Discharge Date:  08/15/2016               Expected Discharge Plan:  Home/Self Care  In-House Referral:  NA  Discharge planning Services  CM Consult, Medication Assistance  Post Acute Care Choice:  NA Choice offered to:  NA  DME Arranged:  N/A DME Agency:  NA  HH Arranged:  NA HH Agency:  NA  Status of Service:  Completed, signed off  If discussed at [place] of Stay Meetings, dates discussed:    Additional Comments:  Erenest Rasher, RN 08/15/2016, 10:53 AM

## 2016-08-16 NOTE — Discharge Summary (Addendum)
Physician Discharge Summary  Noah Roach MRN: [id number] DOB/AGE: 1971/03/26 45 y.o.  PCP: Mabeline Caras, NP   Admit date: 08/09/2016 Discharge date: 08/16/2016  Discharge Diagnoses:    Principal Problem:   Cough Active Problems:   Recurrent pneumonia   Syncope   Fall   Pulmonary infiltrates    Follow-up recommendations Follow-up with PCP in 3-5 days , including all  additional recommended appointments as below Follow-up CBC, CMP in 3-5 days Continue prednisone 40 mg BID Hospital Follow Up Scheduled for 08/28/2016 at 3 pm with Rexene Edison, NP     Discharge Medication List as of 08/15/2016 11:40 AM    START taking these medications   Details  dextromethorphan-guaiFENesin (MUCINEX DM) 30-600 MG 12hr tablet Take 1 tablet by mouth 2 (two) times daily., Starting Tue 08/15/2016, Normal    predniSONE (DELTASONE) 20 MG tablet Take 2 tablets (40 mg total) by mouth 2 (two) times daily with a meal., Starting Tue 08/15/2016, Until Thu 09/14/2016, Normal   Azithromycin 500 mg daily for 2 weeks     STOP taking these medications     levofloxacin (LEVAQUIN) 750 MG tablet          Discharge Condition: Stable   Discharge Instructions Get Medicines reviewed and adjusted: Please take all your medications with you for your next visit with your Primary MD  Please request your Primary MD to go over all hospital tests and procedure/radiological results at the follow up, please ask your Primary MD to get all Hospital records sent to his/her office.  If you experience worsening of your admission symptoms, develop shortness of breath, life threatening emergency, suicidal or homicidal thoughts you must seek medical attention immediately by calling 911 or calling your MD immediately if symptoms less severe.  You must read complete instructions/literature along with all the possible adverse reactions/side effects for all the Medicines you take and that have been prescribed to  you. Take any new Medicines after you have completely understood and accpet all the possible adverse reactions/side effects.   Do not drive when taking Pain medications.   Do not take more than prescribed Pain, Sleep and Anxiety Medications  Special Instructions: If you have smoked or chewed Tobacco in the last 2 yrs please stop smoking, stop any regular Alcohol and or any Recreational drug use.  Wear Seat belts while driving.  Please note  You were cared for by a hospitalist during your hospital stay. Once you are discharged, your primary care physician will handle any further medical issues. Please note that NO REFILLS for any discharge medications will be authorized once you are discharged, as it is imperative that you return to your primary care physician (or establish a relationship with a primary care physician if you do not have one) for your aftercare needs so that they can reassess your need for medications and monitor your lab values.  Discharge Instructions    Diet - low sodium heart healthy    Complete by:  As directed    Increase activity slowly    Complete by:  As directed        No Known Allergies    Disposition: 01-Home or Self Care   Consults:  Pulmonary     Significant Diagnostic Studies:  Dg Chest 2 View  Result Date: 08/09/2016 CLINICAL DATA:  Pneumonia last month, persistent cough and shortness of breath EXAM: CHEST  2 VIEW COMPARISON:  07/24/2016 FINDINGS: Normal heart size, mediastinal contours, and pulmonary vascularity. Persistent pulmonary infiltrates diffusely throughout  LEFT lung and at mid to lower RIGHT lung. Probable component of atelectasis at minor fissure. No definite pleural effusion or pneumothorax. IMPRESSION: Persistent BILATERAL pulmonary infiltrates consistent with pneumonia, probably slightly increased on LEFT since previous exam. Electronically Signed   By: Lavonia Dana M.D.   On: 08/09/2016 20:59   Dg Chest 2 View  Result Date:  07/24/2016 CLINICAL DATA:  Pneumonia.  Shortness of breath. EXAM: CHEST  2 VIEW COMPARISON:  06/22/2016. FINDINGS: Heart size normal. Progressive multifocal bilateral patchy infiltrates are noted. No prominent pleural effusion or pneumothorax. Heart size normal. No acute bony abnormality . IMPRESSION: Progressive or recurrent multifocal bilateral patchy pulmonary infiltrates consistent with multifocal pneumonia. Close follow-up exams to demonstrate resolution. If there is no resolution contrast-enhanced CT of the chest suggested for further evaluation. Electronically Signed   By: Marcello Moores  Register   On: 07/24/2016 11:00   Ct Chest W Contrast  Result Date: 08/10/2016 CLINICAL DATA:  Chronic cough and syncope.  Initial encounter. EXAM: CT CHEST WITH CONTRAST TECHNIQUE: Multidetector CT imaging of the chest was performed during intravenous contrast administration. CONTRAST:  75 mL ISOVUE-300 IOPAMIDOL (ISOVUE-300) INJECTION 61% COMPARISON:  Chest radiograph performed 08/09/2016 FINDINGS: Cardiovascular: The heart remains normal in size. The thoracic aorta is grossly unremarkable in appearance. The great vessels are within normal limits. No calcific atherosclerotic disease is seen. Mediastinum/Nodes: Diffusely enlarged mediastinal and bilateral hilar nodes are seen, measuring up to 2.0 cm at the subcarinal region, and 2.2 cm at the right hilum. Enlarged right paratracheal, periaortic, aortopulmonary window and azygoesophageal recess nodes are seen. No pericardial effusion is identified. The visualized portions of the thyroid gland are unremarkable. No axillary lymphadenopathy is seen. Lungs/Pleura: Diffuse interstitial opacification is noted at the left lower lobe, and peripheral patchy airspace opacity is seen within the remainder of both lungs, with nodular airspace opacities at the left upper lobe. No pleural effusion or pneumothorax is seen. No dominant mass is identified. Upper Abdomen: The visualized portions  of the liver and the spleen are unremarkable in appearance. The gallbladder, visualized portions of the pancreas and adrenal glands are unremarkable. The visualized portions of the kidneys are within normal limits. Musculoskeletal: No acute osseous abnormalities are identified. The visualized musculature is unremarkable in appearance. IMPRESSION: 1. Diffuse interstitial opacification of the left lower lobe. Peripheral patchy airspace opacity within the remainder of both lungs, with nodular airspace opacities at the left upper lobe. This is an unusual appearance, and could reflect lymphangitic spread of tumor, given extensive mediastinal and bilateral hilar lymphadenopathy, or an atypical chronic infection, or some form of interstitial lung disease or pneumonitis. Would correlate for evidence of a primary malignancy; the mediastinal or hilar nodes would be amenable to biopsy, as deemed clinically appropriate. 2. Diffusely enlarged mediastinal and bilateral hilar nodes seen, measuring up to 2.0 cm at the subcarinal region, and 2.2 cm at the right hilum. Electronically Signed   By: Garald Balding M.D.   On: 08/10/2016 01:24   Dg Chest Port 1 View  Result Date: 08/11/2016 CLINICAL DATA:  Status post bronchoscopy. EXAM: PORTABLE CHEST 1 VIEW COMPARISON:  Radiographs of August 09, 2016. FINDINGS: Stable cardiomediastinal silhouette. No pneumothorax is noted. Stable diffuse left lung opacities and right midlung and basilar opacities compared to prior exam, most consistent with pneumonia. Bony thorax is unremarkable. IMPRESSION: Stable bilateral lung opacities as described above, most consistent with pneumonia. Electronically Signed   By: Marijo Conception, M.D.   On: 08/11/2016 15:06   Ct Maxillofacial  Wo Contrast  Result Date: 08/10/2016 CLINICAL DATA:  Syncope. Hit face and mouth on concrete, with broken teeth. Initial encounter. EXAM: CT MAXILLOFACIAL WITHOUT CONTRAST TECHNIQUE: Multidetector CT imaging of the  maxillofacial structures was performed. Multiplanar CT image reconstructions were also generated. A small metallic BB was placed on the right temple in order to reliably differentiate right from left. COMPARISON:  None. FINDINGS: Osseous: There is a mildly displaced fracture through the central anterior edge of the maxilla, overlying the central maxillary incisors bilaterally, with mild anterior displacement of the central maxillary incisors. The maxilla and mandible appear intact. The nasal bone is unremarkable in appearance. The visualized dentition demonstrates no acute abnormality. Orbits: The orbits are intact bilaterally. Sinuses: Inspissated mucus is noted at the left maxillary sinus. The remaining visualized paranasal sinuses and mastoid air cells are well-aerated. Soft tissues: Soft tissue swelling is noted overlying the fracture site. The parapharyngeal fat planes are preserved. The nasopharynx, oropharynx and hypopharynx are unremarkable in appearance. The visualized portions of the valleculae and piriform sinuses are grossly unremarkable.The parotid and submandibular glands are within normal limits. No cervical lymphadenopathy is seen. Limited intracranial: Prominence of the ventricles raises question for mild cortical volume loss. IMPRESSION: 1. Mildly displaced fracture through the central anterior edge of the maxilla, overlying the central maxillary incisors bilaterally, with mild anterior displacement of the central maxillary incisors. 2. Soft tissue swelling overlying the fracture site. 3. Inspissated mucus at the left maxillary sinus. Electronically Signed   By: Garald Balding M.D.   On: 08/10/2016 00:59   Dg C-arm Bronchoscopy  Result Date: 08/11/2016 CLINICAL DATA:  C-ARM BRONCHOSCOPY Fluoroscopy was utilized by the requesting physician.  No radiographic interpretation.        Filed Weights   08/09/16 1946 08/10/16 0359 08/11/16 1322  Weight: 101.6 kg (224 lb) 106.1 kg (234 lb) 101.6  kg (224 lb)     Microbiology: Recent Results (from the past 240 hour(s))  Culture, blood (Routine X 2) w Reflex to ID Panel     Status: None   Collection Time: 08/10/16  1:05 AM  Result Value Ref Range Status   Specimen Description BLOOD LEFT ARM  Final   Special Requests BOTTLES DRAWN AEROBIC AND ANAEROBIC 5ML  Final   Culture NO GROWTH 5 DAYS  Final   Report Status 08/15/2016 FINAL  Final  Culture, blood (Routine X 2) w Reflex to ID Panel     Status: None   Collection Time: 08/10/16  1:10 AM  Result Value Ref Range Status   Specimen Description BLOOD LEFT HAND  Final   Special Requests BOTTLES DRAWN AEROBIC AND ANAEROBIC 5ML  Final   Culture NO GROWTH 5 DAYS  Final   Report Status 08/15/2016 FINAL  Final  Respiratory Panel by PCR     Status: Abnormal   Collection Time: 08/10/16  2:35 AM  Result Value Ref Range Status   Adenovirus NOT DETECTED NOT DETECTED Final   Coronavirus 229E NOT DETECTED NOT DETECTED Final   Coronavirus HKU1 NOT DETECTED NOT DETECTED Final   Coronavirus NL63 NOT DETECTED NOT DETECTED Final   Coronavirus OC43 NOT DETECTED NOT DETECTED Final   Metapneumovirus NOT DETECTED NOT DETECTED Final   Rhinovirus / Enterovirus DETECTED (A) NOT DETECTED Final   Influenza A NOT DETECTED NOT DETECTED Final   Influenza B NOT DETECTED NOT DETECTED Final   Parainfluenza Virus 1 NOT DETECTED NOT DETECTED Final   Parainfluenza Virus 2 NOT DETECTED NOT DETECTED Final   Parainfluenza  Virus 3 NOT DETECTED NOT DETECTED Final   Parainfluenza Virus 4 NOT DETECTED NOT DETECTED Final   Respiratory Syncytial Virus NOT DETECTED NOT DETECTED Final   Bordetella pertussis NOT DETECTED NOT DETECTED Final   Chlamydophila pneumoniae NOT DETECTED NOT DETECTED Final   Mycoplasma pneumoniae NOT DETECTED NOT DETECTED Final  Acid Fast Smear (AFB)     Status: None   Collection Time: 08/11/16  2:37 PM  Result Value Ref Range Status   AFB Specimen Processing Concentration  Final   Acid Fast  Smear Negative  Final    Comment: (NOTE) Performed At: [facility] [address] [place], [place] [id number] Lindon Romp MD [id number]    Source (AFB) BRONCHIAL ALVEOLAR LAVAGE  Final    Comment: LEFT UPPER LOBE  Culture, fungus without smear     Status: None (Preliminary result)   Collection Time: 08/11/16  2:37 PM  Result Value Ref Range Status   Specimen Description BRONCHIAL ALVEOLAR LAVAGE  Final   Special Requests LEFT UPPER LOBE  Final   Culture NO FUNGUS ISOLATED AFTER 3 DAYS  Final   Report Status PENDING  Incomplete  Culture, respiratory (NON-Expectorated)     Status: None   Collection Time: 08/11/16  2:37 PM  Result Value Ref Range Status   Specimen Description BRONCHIAL ALVEOLAR LAVAGE  Final   Special Requests LEFT UPPER LOBE  Final   Gram Stain   Final    MODERATE WBC PRESENT, PREDOMINANTLY MONONUCLEAR RARE GRAM NEGATIVE RODS RARE GRAM POSITIVE COCCI IN PAIRS    Culture Consistent with normal respiratory flora.  Final   Report Status 08/13/2016 FINAL  Final  Acid Fast Smear (AFB)     Status: None   Collection Time: 08/11/16  2:40 PM  Result Value Ref Range Status   AFB Specimen Processing Concentration  Final   Acid Fast Smear Negative  Final    Comment: (NOTE) Performed At: [facility] [address] [place], [place] [id number] Lindon Romp MD [id number]    Source (AFB) BRONCHIAL ALVEOLAR LAVAGE  Final    Comment: RIGHT UPPER LOBE  Culture, fungus without smear     Status: None (Preliminary result)   Collection Time: 08/11/16  2:40 PM  Result Value Ref Range Status   Specimen Description BRONCHIAL ALVEOLAR LAVAGE  Final   Special Requests RIGHT UPPER LOBE  Final   Culture NO FUNGUS ISOLATED AFTER 3 DAYS  Final   Report Status PENDING  Incomplete  Pneumocystis smear by DFA     Status: None   Collection Time: 08/11/16  2:40 PM  Result Value Ref Range Status   Specimen Source-[facility] BRONCHIAL ALVEOLAR LAVAGE  Final     Comment: RIGHT UPPER LOBE   Pneumocystis jiroveci Ag NEGATIVE  Final    Comment: Performed at [place] [facility]  Culture, respiratory (NON-Expectorated)     Status: None   Collection Time: 08/11/16  2:40 PM  Result Value Ref Range Status   Specimen Description BRONCHIAL ALVEOLAR LAVAGE  Final   Special Requests RIGHT UPPER LOBE  Final   Gram Stain   Final    RARE WBC PRESENT,BOTH PMN AND MONONUCLEAR FEW GRAM NEGATIVE RODS FEW GRAM POSITIVE COCCI IN PAIRS    Culture Consistent with normal respiratory flora.  Final   Report Status 08/13/2016 FINAL  Final       Blood Culture    Component Value Date/Time   SDES BRONCHIAL ALVEOLAR LAVAGE 08/11/2016 1440   SDES BRONCHIAL  ALVEOLAR LAVAGE 08/11/2016 1440   SPECREQUEST RIGHT UPPER LOBE 08/11/2016 1440   SPECREQUEST RIGHT UPPER LOBE 08/11/2016 1440   CULT NO FUNGUS ISOLATED AFTER 3 DAYS 08/11/2016 1440   CULT Consistent with normal respiratory flora. 08/11/2016 1440   REPTSTATUS PENDING 08/11/2016 1440   REPTSTATUS 08/13/2016 FINAL 08/11/2016 1440      Labs: No results found for this or any previous visit (from the past 48 hour(s)).   Lipid Panel  No results found for: CHOL, TRIG, HDL, CHOLHDL, VLDL, LDLCALC, LDLDIRECT   No results found for: HGBA1C   Lab Results  Component Value Date   CREATININE 0.96 08/12/2016     HPI :   45 yo male presented with cough, dyspnea, dizziness, and syncope.  His cough started in July 2017.  He has been getting pleuritic chest pain.  He had improvement in cough after therapy in July.  He had recent trip to [place] and his cough returned.  He was treated with antibiotics for pneumonia in August 2017.  He had CT chest which showed b/l ASD and LAN with atypical pattern.    He has been on several doses of antibiotics w/o improvement.  He was given 5 days of prednisone without improvement.  He has lost about 10 lbs.  He denies fever, chills, or hemoptysis.  No sweats, skin rash,  joint swelling, or gland swelling.  He denies abdominal pain, nausea, or diarrhea.  He is originally from [place].  He has lived in [place] for about 15 yrs.  He works as a Administrator.  He denies animal/bird exposures.  No recent sick exposures.  He says his brother in [place] died two weeks ago, and was told he had growths in his lungs.  He thinks his mother had something similar.  He is not sure what disease they might have had.  HOSPITAL COURSE:    Intractable cough/pulmonary infiltrates/hilar lymphadenopathy Respiratory virus panel positive for rhinovirus However this is chronic, He has not responded to several courses of outpatient antibiotics and prednisone.  Patient initiated on azithromycin 10/19> recommended to be continued by [facility] was consulted, patient underwent bronchoscopy 10/20, Biopsy Left Lower Lobe - NON-NECROTIZING GRANULOMAS. - NO EVIDENCE OF MALIGNANCY. - SEE MICROSCOPIC DESCRIPTION CT chest 10/19 >> b/l hilar and mediastinal LAN up to 2.2 cm, diffuse interstitial ASD LLL, patchy peripheral ASD b/l, nodular ASD LUL Rhinovirus positive on respiratory PCR 10/19.  ANA positive from 10/20 with spindle apparatus pattern. So far the rest has been negative. Biopsy 10/20 indicates non-necrotizing granulomas without evidence of malignancy. Consider in differential diagnosis: sarcoid and infectious granulomas/ auto immune. Other information including from bronchoscopy-Gram stain consistent with gram-negative rods, gram-positive cocci in pairs Consistent with normal respiratory flora.  Will need out patient pulmonary follow up with Dr. Halford Chessman after hospital follow up. [facility] Follow Up Scheduled for 08/28/2016 at 3 pm with Rexene Edison, NP)    Syncope and fall:Etiology is not clear. May be due to vasovagal syncope secondary to severe cough. CT maxillofacial showed mildly displaced fracture maxilla.Patient has very low risk of stroke. No focal neurological  findings on physical examination, less likely to have stroke. Orthostatic vitals negative, no arrhythmias noted  Patient could have vasovagal syncope related to his cough 2-D echo  shows normal EF 55-60% with grade 1 diastolic dysfunction, no wall motion abnormalities.  Discharge Exam:   Blood pressure 121/79, pulse 69, temperature 97.3 F (36.3 C), temperature source Oral, resp. rate 18, height 5\' 8"  (1.727  m), weight 101.6 kg (224 lb), SpO2 97 %. General exam: Appears calm and comfortable  Respiratory system: Clear to auscultation. Respiratory effort normal. Cardiovascular system: S1 & S2 heard, RRR. No JVD, murmurs, rubs, gallops or clicks. No pedal edema. Gastrointestinal system: Abdomen is nondistended, soft and nontender. No organomegaly or masses felt. Normal bowel sounds heard. Central nervous system: Alert and oriented. No focal neurological deficits. Extremities: Symmetric 5 x 5 power. Skin: No rashes, lesions or ulcers Psychiatry: Judgement and insight appear normal. Mood & affect appropriate.     Follow-up Information    Rexene Edison, NP. Go on 08/28/2016.   Specialty:  Pulmonary Disease Why:  3:00. [facility]. 2nd floor Contact information: [address] [place] [address] [phone]        pcp. Schedule an appointment as soon as possible for a visit in 2 days.   Why:  hospital follow up [place]          Signed: Hartwell Vandiver 08/16/2016, 8:27 AM        Time spent >45 mins

## 2016-08-28 ENCOUNTER — Encounter: Payer: Self-pay | Admitting: Adult Health

## 2016-08-28 ENCOUNTER — Ambulatory Visit (INDEPENDENT_AMBULATORY_CARE_PROVIDER_SITE_OTHER): Payer: PRIVATE HEALTH INSURANCE | Admitting: Adult Health

## 2016-08-28 VITALS — BP 138/70 | HR 78 | Temp 98.1°F | Ht 68.0 in | Wt 219.2 lb

## 2016-08-28 DIAGNOSIS — D869 Sarcoidosis, unspecified: Secondary | ICD-10-CM | POA: Diagnosis not present

## 2016-08-28 DIAGNOSIS — J189 Pneumonia, unspecified organism: Secondary | ICD-10-CM

## 2016-08-28 MED ORDER — PREDNISONE 20 MG PO TABS: ORAL_TABLET | ORAL | 1 refills | Status: AC

## 2016-08-28 NOTE — Patient Instructions (Addendum)
Decrease Prednisone 30mg  daily for 1 week then 20mg  daily  And hold at this dose.  Labs today .  Follow up with Primary MD as well.  Follow up Dr. Ashok Cordia in 6 weeks with PFT and chest xray .  Please contact office for sooner follow up if symptoms do not improve or worsen or seek emergency care

## 2016-08-28 NOTE — Progress Notes (Signed)
Limited note reviewed. No assessment or plan within note.   Sonia Baller Ashok Cordia, M.D. [facility] Pager:  [phone] After 3pm or if no response, call [phone] 11:27 PM 08/28/16

## 2016-08-28 NOTE — Progress Notes (Signed)
Subjective:    Patient ID: Noah Roach, male    DOB: 1971-10-12, 45 y.o.   MRN: [id number]  HPI 45 yo male never smoker (from [place], lived in [place] 2003) . Seen for pulmonary consult during hospital follow up abnormal CT chest with pulmonary infiltrates w/ mediastinal /hilar adenopathy.   TEST  CT chest 10/19 >> b/l hilar and mediastinal LAN up to 2.2 cm, diffuse interstitial ASD LLL, patchy peripheral ASD b/l, nodular ASD LUL Serology 10/20 >>  Bronchoscopy 10/20 >> See Below  Diagnosis Bronchus, biopsy, Left Lower Lobe - NON-NECROTIZING GRANULOMAS. - NO EVIDENCE OF MALIGNANCY. - SEE MICROSCOPIC DESCRIPTION.  Microscopic Comment There are fragments of bronchial mucosa one of which shows non-necrotizing granulomas including multinucleated giant cells. The differential includes sarcoid and infectious granulomas. Special stains will be performed and reported as an addendum. No polarizable foreign material is identified. (JDP:ecj 08/14/2016)  ADDENDUM: No acid fast Bacilli are identified with AFB stain, and no fungi identified are identified with GMS or PAS  RVP + rhinovirus .   08/28/2016 Post hospital follow up  Pt returns for follow up from recent hospitalization  Patient was admitted October 18 through October 25 for progressive cough, shortness of breath. CT chest showed bilateral pulmonary infiltrates with mediastinal and hilar adenopathy. Pulmonary did a consult during his hospital stay. Panel was positive for rhinovirus. Patient underwent a bronchoscopy on October 20. Pathology showed nonnecrotizing granulomas with no evidence of malignancy. ANA was positive. Respiratory culture was negative AFB and fungal. Prelim.  Negative. Patient was discharged on Zithromax. And a prednisone 40 mg daily Since discharge he is doing very well. Cough has resolved.  Denies chest pain, rash, fever, chest pain or edema.    Past Medical History:  Diagnosis Date  . No pertinent  past medical history   . Pneumonia    Current Outpatient Prescriptions on File Prior to Visit  Medication Sig Dispense Refill  . predniSONE (DELTASONE) 20 MG tablet Take 2 tablets (40 mg total) by mouth 2 (two) times daily with a meal. 120 tablet 1  . dextromethorphan-guaiFENesin (MUCINEX DM) 30-600 MG 12hr tablet Take 1 tablet by mouth 2 (two) times daily. (Patient not taking: Reported on 08/28/2016) 30 tablet 0   No current facility-administered medications on file prior to visit.       Review of Systems Constitutional:   No  weight loss, night sweats,  Fevers, chills,  +fatigue, or  lassitude.  HEENT:   No headaches,  Difficulty swallowing,  Tooth/dental problems, or  Sore throat,                No sneezing, itching, ear ache, nasal congestion, post nasal drip,   CV:  No chest pain,  Orthopnea, PND, swelling in lower extremities, anasarca, dizziness, palpitations, syncope.   GI  No heartburn, indigestion, abdominal pain, nausea, vomiting, diarrhea, change in bowel habits, loss of appetite, bloody stools.   Resp: No shortness of breath with exertion or at rest.  No excess mucus, no productive cough,  No non-productive cough,  No coughing up of blood.  No change in color of mucus.  No wheezing.  No chest wall deformity  Skin: no rash or lesions.  GU: no dysuria, change in color of urine, no urgency or frequency.  No flank pain, no hematuria   MS:  No joint pain or swelling.  No decreased range of motion.  No back pain.  Psych:  No change in mood or affect. No depression  or anxiety.  No memory loss.         Objective:   Physical Exam Vitals:   08/28/16 1506  BP: 138/70  Pulse: 78  Temp: 98.1 F (36.7 C)  TempSrc: Oral  SpO2: 94%  Weight: 219 lb 3.2 oz (99.4 kg)  Height: 5\' 8"  (1.727 m)   GEN: A/Ox3; pleasant , NAD , obese    HEENT:  [place]/AT,  EACs-clear, TMs-wnl, NOSE-clear, THROAT-clear, no lesions, no postnasal drip or exudate noted.   NECK:  Supple w/ fair ROM;  no JVD; normal carotid impulses w/o bruits; no thyromegaly or nodules palpated; no lymphadenopathy.    RESP  Clear  P & A; w/o, wheezes/ rales/ or rhonchi. no accessory muscle use, no dullness to percussion  CARD:  RRR, no m/r/g  , no peripheral edema, pulses intact, no cyanosis or clubbing.  GI:   Soft & nt; nml bowel sounds; no organomegaly or masses detected.   Musco: Warm bil, no deformities or joint swelling noted.   Neuro: alert, no focal deficits noted.    Skin: Warm, no lesions or rashes  Yavuz Kirby NP-C  [facility]  08/28/2016         Assessment & Plan:

## 2016-08-28 NOTE — Assessment & Plan Note (Addendum)
Most likely sarcoid w/ non necrotizing granulomas on FOB bx .  Check ACE  Cont to taper steroids  Check cxr and PFT on reutrn  Steroids pt educaiton given   Plan  Patient Instructions  Decrease Prednisone 30mg  daily for 1 week then 20mg  daily  And hold at this dose.  Labs today .  Follow up with Primary MD as well.  Follow up Dr. Ashok Cordia in 6 weeks with PFT and chest xray .  Please contact office for sooner follow up if symptoms do not improve or worsen or seek emergency care

## 2016-09-01 LAB — CULTURE, FUNGUS WITHOUT SMEAR

## 2016-09-25 LAB — ACID FAST CULTURE WITH REFLEXED SENSITIVITIES: ACID FAST CULTURE - AFSCU3: NEGATIVE

## 2016-09-25 LAB — ACID FAST CULTURE WITH REFLEXED SENSITIVITIES (MYCOBACTERIA): Acid Fast Culture: NEGATIVE

## 2016-10-12 ENCOUNTER — Ambulatory Visit (INDEPENDENT_AMBULATORY_CARE_PROVIDER_SITE_OTHER)
Admission: RE | Admit: 2016-10-12 | Discharge: 2016-10-12 | Disposition: A | Discharge status: Home / Self Care | Payer: PRIVATE HEALTH INSURANCE | Source: Ambulatory Visit | Attending: Pulmonary Disease | Admitting: Pulmonary Disease

## 2016-10-12 ENCOUNTER — Ambulatory Visit (INDEPENDENT_AMBULATORY_CARE_PROVIDER_SITE_OTHER): Payer: PRIVATE HEALTH INSURANCE | Admitting: Pulmonary Disease

## 2016-10-12 ENCOUNTER — Encounter: Payer: Self-pay | Admitting: Pulmonary Disease

## 2016-10-12 VITALS — BP 120/72 | HR 75 | Ht 68.0 in | Wt 223.0 lb

## 2016-10-12 DIAGNOSIS — Z23 Encounter for immunization: Secondary | ICD-10-CM

## 2016-10-12 DIAGNOSIS — R05 Cough: Secondary | ICD-10-CM

## 2016-10-12 DIAGNOSIS — R059 Cough, unspecified: Secondary | ICD-10-CM

## 2016-10-12 DIAGNOSIS — J189 Pneumonia, unspecified organism: Secondary | ICD-10-CM

## 2016-10-12 DIAGNOSIS — D869 Sarcoidosis, unspecified: Secondary | ICD-10-CM | POA: Diagnosis not present

## 2016-10-12 DIAGNOSIS — J984 Other disorders of lung: Secondary | ICD-10-CM | POA: Diagnosis not present

## 2016-10-12 DIAGNOSIS — R002 Palpitations: Secondary | ICD-10-CM

## 2016-10-12 LAB — PULMONARY FUNCTION TEST
DL/VA % pred: 104 %
DL/VA: 4.83 ml/min/mmHg/L
DLCO cor % pred: 62 %
DLCO cor: 19.86 ml/min/mmHg
DLCO unc % pred: 62 %
DLCO unc: 19.69 ml/min/mmHg
FEF 25-75 Post: 4.9 L/s
FEF 25-75 Pre: 3.98 L/s
FEF2575-%Change-Post: 22 %
FEF2575-%Pred-Post: 142 %
FEF2575-%Pred-Pre: 115 %
FEV1-%Change-Post: 2 %
FEV1-%Pred-Post: 88 %
FEV1-%Pred-Pre: 85 %
FEV1-Post: 2.98 L
FEV1-Pre: 2.91 L
FEV1FVC-%Change-Post: 1 %
FEV1FVC-%Pred-Pre: 107 %
FEV6-%Change-Post: 2 %
FEV6-%Pred-Post: 81 %
FEV6-%Pred-Pre: 79 %
FEV6-Post: 3.33 L
FEV6-Pre: 3.27 L
FEV6FVC-%Change-Post: 1 %
FEV6FVC-%Pred-Post: 101 %
FEV6FVC-%Pred-Pre: 100 %
FVC-%Change-Post: 0 %
FVC-%Pred-Post: 79 %
FVC-%Pred-Pre: 79 %
FVC-Post: 3.35 L
FVC-Pre: 3.33 L
Post FEV1/FVC ratio: 89 %
Post FEV6/FVC ratio: 99 %
Pre FEV1/FVC ratio: 87 %
Pre FEV6/FVC Ratio: 98 %
RV % pred: 67 %
RV: 1.31 L
TLC % pred: 65 %
TLC: 4.46 L

## 2016-10-12 MED ORDER — PREDNISONE 10 MG PO TABS: 5.0000 mg | ORAL_TABLET | Freq: Every day | ORAL | 1 refills | Status: AC

## 2016-10-12 NOTE — Progress Notes (Signed)
Test reviewed.  

## 2016-10-12 NOTE — Patient Instructions (Addendum)
   We are starting you on a gradual taper of your Prednisone. We are switching you over to 5mg  pills and you will take them as follows:  3 pills daily for 4 weeks, then  2 pills daily until you see me back  Call me if you have any new symptoms or difficulty breathing before your next appointment.  Remember to schedule an appointment to be seen by an ophthalmologist (eye doctor) and be sure to tell them you have sarcoidosis.   Remember to come in next week to get your skin TB test.   I will see you back in 8 weeks or sooner if needed.   TESTS ORDERED: 1. Spirometry with DLCO at next appointment 2. 6MWT on room air at next appointment 3. Serum ACE, LDH, Quantiferon-TB, Hepatitis B Surface Antibody, Hepatitis B Core Antibody, & Hepatitis B surface Antigen. 4. Skin PPD Next week  5. Cardiac MRI for Sarcoidosis

## 2016-10-12 NOTE — Progress Notes (Addendum)
Subjective:    Patient ID: Noah Roach, male    DOB: 03/16/1971, 45 y.o.   MRN: [id number]  C.C.:  Follow-up for Sarcoidosis & Restrictive Lung Disease.  HPI Sarcoidosis: She underwent bronchoscopy on 08/11/16 by Dr. Halford Chessman. And had bilateral upper lobe lavage is performed as well as transbronchial biopsies of the left lower lobe. Patient weaned to 20 mg daily of prednisone after follow-up posthospitalization. He reports his breathing is doing significantly better. He denies any coughing. Denies any palpitations. No chest tightness or pressure. No syncope or near syncope. No eye swelling, pain, or erythema. Did have syncope on admission to hospital. Previously had palpitations during his hospitalization.   Restrictive Lung Disease:  Denies any problems with deep breathing. No history of autoimmune disease in his family. He has gained weight.   Review of Systems He does report a "weakness" with sitting for a long time. He denies any joint pain, stiffness, or swelling before or after his hospitalization. No rashes or bruising.   No Known Allergies  Current Outpatient Prescriptions on File Prior to Visit  Medication Sig Dispense Refill  . predniSONE (DELTASONE) 20 MG tablet Take 1.5 tablets daily for 1 week then 1 tablet daily and hold at this dose 120 tablet 1   No current facility-administered medications on file prior to visit.     Past Medical History:  Diagnosis Date  . No pertinent past medical history   . Pneumonia     Past Surgical History:  Procedure Laterality Date  . LIPOMA EXCISION  11/06/2011   Procedure: EXCISION LIPOMA;  Surgeon: Rolm Bookbinder, MD;  Location: [facility];  Service: General;  Laterality: N/A;  Excision of back mass  . NO PAST SURGERIES    . VIDEO BRONCHOSCOPY Bilateral 08/11/2016   Procedure: VIDEO BRONCHOSCOPY WITH FLUORO;  Surgeon: Chesley Mires, MD;  Location: [facility];  Service: Cardiopulmonary;  Laterality: Bilateral;     Family History  Problem Relation Age of Onset  . Lung disease Mother   . Diabetes Mellitus II Father   . Hypertension Father   . Lung cancer Brother   . Rheumatologic disease Neg Hx     Social History   Social History  . Marital status: Married    Spouse name: N/A  . Number of children: N/A  . Years of education: N/A   Social History Main Topics  . Smoking status: Never Smoker  . Smokeless tobacco: Never Used  . Alcohol use No  . Drug use: No  . Sexual activity: Not Asked   Other Topics Concern  . None   Social History Narrative   Originally from [place]. He has lived in the [place] for 15 years. He previously did live in [place] for 4 months after he first arrived. Has lived in [place] since then. 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 He denies any exposure. No pets currently. No bird, mold, or hot tub exposure. No hobbies other than watching movies.       Objective:   Physical Exam BP 120/72 (BP Location: Right Arm, Patient Position: Sitting, Cuff Size: Normal)   Pulse 75   Ht 5\' 8"  (1.727 m)   Wt 223 lb (101.2 kg)   SpO2 97%   BMI 33.91 kg/m  General:  Awake. Alert. No acute distress. Mild central obesity.  Integument:  Warm & dry. No rash on exposed skin.  HEENT:  Moist mucus membranes. No oral ulcers. No scleral  injection, erythema, or icterus.  Cardiovascular:  Regular rate and rhythm. No edema. No appreciable JVD.  Pulmonary:  Good aeration & clear to auscultation bilaterally. Symmetric chest wall expansion. No accessory muscle use on room air. Abdomen: Soft. Normal bowel sounds. Protuberant. Grossly nontender. Musculoskeletal:  Normal bulk and tone. Hand grip strength 5/5 bilaterally. No joint deformity or effusion appreciated. Neurological:  2+ bilateral brachial radialis reflexes. Cranial nerves 2-12 intact. No meningismus.  PFT 10/12/16: FVC 3.33 L (79%) FEV1 2.91 L (85%) FEV1/FVC 0.87 FEF 25-75 3.98 L (115%)  negative bronchodilator response TLC 4.46 L (65%) RV 67% ERV 109% DLCO corrected 62% (Hgb 14.3)  IMAGING CXR PA/LAT 10/12/16 (personally reviewed by me): Increased interstitial markings in the lower lung zones particularly. No focal opacity or consolidation appreciated. No mass or nodule appreciated. No pleural effusion. Low lung volumes. Heart normal in size & mediastinum normal in contour.  CT CHEST W/ CONTRAST 08/10/16 (personally reviewed by me): Bulky mediastinal and hilar adenopathy up to 2.2 cm. No pleural effusion or thickening. No pericardial effusion. Consolidation with air bronchograms and opacification in left lower lobe. Patchy bilateral particularly subpleural opacification as well.  CARDIAC TTE (08/13/16): LV normal in size with EF 55-60%. Grade 1 diastolic dysfunction. LA & RA normal in size. RV normal in size and function. Pulmonary artery systolic pressure 23 mmHg. No aortic stenosis or regurgitation. No mitral stenosis or regurgitation. Trivial pulmonic regurgitation. Trivial tricuspid regurgitation. No pericardial effusion. EKG (08/10/16):  Personally reviewed by me. No conduction delay. Sinus tachycardia. QTc normal.   PATHOLOGY Transbronchial Biopsies LLL 08/11/16:  Fragments of bronchial mucosa one of which shows non-necrotizing granulomas including multinucleated. Stains negative for AFB & fungus.  giant cells. BAL LUL 08/11/16:  No malignancy BAL RUL 08/11/16:  No malignancy  MICROBIOLOGY Respiratory Panel PCR 08/10/16:  Rhinovirus/Enterovirus HIV 08/10/16:  Nonreactive  BAL LUL 08/11/16:  Normal Respiratory [place] / AFB negative / Fungus negative  BAL RUL 08/11/16:  Normal Respiratory [place] / AFB negative / Fungus negative / PCP DFA Negative   LABS 08/18/16 CBC: 13.1/12.1/37.0/381 BMP: 136/3.8/105/24/18/0.96513/8.8 LFT: 2.8/6.4/0.3/57/22/19  08/10/16 ANA:  Positive Anti-Jo1:  <0.2 RF:  12.2 C-ANCA:  <1:20 P-ANCA:  <1:20 Atypical P-ANCA:  <1:20 SSA:   <0.2 SSB:  <0.2 SCL-70:  <0.2    Assessment & Plan:  45 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 Patient's previous admission for syncope and fall with underlying sarcoidosis is certainly concerning for cardiac sarcoidosis. I spent a significant amount of time today educating the patient on the pathophysiology of sarcoidosis as well as the potential involvement of other organ systems such as the eyes, central nervous system, etc. We are going to gradually taper the patient's oral prednisone therapy while evaluating for possible cardiac sarcoidosis. I'm also checking for latent hepatitis B and tuberculosis in the event that we need to initiate methotrexate as a steroid sparing agent. I'm holding off on repeat CT imaging of the chest at this time given significant improvement in his chest x-ray. I instructed the patient contact my office if he had any new breathing problems before his next appointment or new symptoms as we further taper his oral prednisone therapy.  1. Stage II Sarcoidosis: Continuing prednisone taper over the next 8 weeks. Recommended ophthalmologic exam. Checking serum ACE level, QuantiFERON-TB, skin PPD, hepatitis B surface antigen, hepatitis B surface antibody, & hepatitis B core antibody. Consider initiation of methotrexate as steroid sparing agent after next appointment depending upon results of testing. Checking spirometry with DLCO  at next appointment and 6 minute walk test on room air. 2. Restrictive Lung Disease:  Likely secondary to underlying parenchymal disease and mild central obesity. Holding off on repeat CT chest imaging at this time. 3. Palpitations with Syncope:  Question possible cardiac sarcoidosis. Checking cardiac MRI. 4. Health Maintenance:  S/P Influenza Vaccine October 2017. Administering Pneumovax 23 today.  5. Follow-up:Return to clinic in 8 weeks or sooner if needed.  Sonia Baller Ashok Cordia, M.D. [facility] Pager:   [phone] After 3pm or if no response, call [phone] 3:08 PM 10/12/16

## 2016-10-19 ENCOUNTER — Encounter: Payer: Self-pay | Admitting: Pulmonary Disease

## 2016-10-19 ENCOUNTER — Telehealth: Payer: Self-pay | Admitting: Pulmonary Disease

## 2016-10-20 ENCOUNTER — Other Ambulatory Visit: Payer: PRIVATE HEALTH INSURANCE

## 2016-10-20 DIAGNOSIS — D869 Sarcoidosis, unspecified: Secondary | ICD-10-CM

## 2016-10-20 DIAGNOSIS — R002 Palpitations: Secondary | ICD-10-CM

## 2016-10-20 DIAGNOSIS — J984 Other disorders of lung: Secondary | ICD-10-CM

## 2016-10-20 NOTE — Telephone Encounter (Signed)
Error.  Nothing noted in the chart for this pt.

## 2016-10-21 LAB — LACTATE DEHYDROGENASE: LDH: 153 U/L (ref 100–220)

## 2016-10-21 LAB — HEPATITIS B SURFACE ANTIBODY, QUANTITATIVE: Hepatitis B-Post: 56.1 m[IU]/mL

## 2016-10-21 LAB — HEPATITIS B SURFACE ANTIGEN: HEP B S AG: NEGATIVE

## 2016-10-21 LAB — HEPATITIS B CORE ANTIBODY, TOTAL: Hep B Core Total Ab: NONREACTIVE

## 2016-10-25 ENCOUNTER — Telehealth: Payer: Self-pay | Admitting: Pulmonary Disease

## 2016-10-25 LAB — ANGIOTENSIN CONVERTING ENZYME: Angiotensin-Converting Enzyme: 54 U/L (ref 9–67)

## 2016-10-25 NOTE — Telephone Encounter (Signed)
JN  Please Advise-   Pt. Is aware that you would not be in the office until tomorrow, pt.called and stated he has decreased his prednisone and he is having a dry cough and feels like he is not getting better. He denies fever,wheezing,sob. He is currently on 15mg  of his prednisone. He was wanted to know is there something else he can do.      predniSONE (DELTASONE) 10 MG tablet [id number]  Order Details  Dose: 5 mg Route: Oral Frequency: Daily  Dispense Quantity:  84 tablet Refills:  1 Fills remaining:  --        Sig: Take 0.5 tablets (5 mg total) by mouth daily. Take three 5mg  tablets daily for 4 weeks. Then two 5mg  tablets until your next appt.            Javier Glazier, MD (Physician) at 10/12/2016 2:58 PM - Signed     We are starting you on a gradual taper of your Prednisone. We are switching you over to 5mg  pills and you will take them as follows: ? 3 pills daily for 4 weeks, then ? 2 pills daily until you see me back  Call me if you have any new symptoms or difficulty breathing before your next appointment.  Remember to schedule an appointment to be seen by an ophthalmologist (eye doctor) and be sure to tell them you have sarcoidosis.   Remember to come in next week to get your skin TB test.   I will see you back in 8 weeks or sooner if needed.

## 2016-10-26 NOTE — Telephone Encounter (Signed)
Spoke with pt. He is aware of JN's recommendation. Nothing further was needed. 

## 2016-10-26 NOTE — Telephone Encounter (Signed)
He should increase his Prednisone back to 20mg  to see if his cough resolves. Thanks.

## 2016-10-30 ENCOUNTER — Telehealth: Payer: Self-pay | Admitting: Pulmonary Disease

## 2016-10-30 ENCOUNTER — Ambulatory Visit (HOSPITAL_COMMUNITY): Admission: RE | Admit: 2016-10-30 | Payer: PRIVATE HEALTH INSURANCE | Source: Ambulatory Visit

## 2016-10-30 NOTE — Telephone Encounter (Signed)
Javier Glazier, MD  Collier Salina, RN        I forgot to put down Anti-CCP with his lab work as well. Can they run it off of the blood we did today or do we need to have him come back to have this test sent? If he needs to come back please repeat an ANA, Comprehensive Panel, ESR, CRP, and Anti-CCP. Thanks.   Called and spoke to Noah Roach. Noah Roach states he has been out of the state for at least a week and will not be back until the end of this week he thinks. Noah Roach states he will call when he gets back in town. Will sign off message.   Will forward to [facility] as FYI.

## 2016-11-01 ENCOUNTER — Telehealth: Payer: Self-pay

## 2016-11-01 NOTE — Telephone Encounter (Signed)
Dr. Ashok Cordia, [facility] sent me a message stating that the patient was a no show for his MRI scheduled for 10/30/2016.

## 2016-11-01 NOTE — Telephone Encounter (Signed)
-----   Message from Corinna Lines sent at 11/01/2016  9:25 AM EST ----- Regarding: MRI   Noah Roach  This patient no showed his MRI appointment on 10-30-16.  [facility]

## 2016-11-02 NOTE — Telephone Encounter (Signed)
Please call the patient because I believe he is out of town. He may have been confused about the time/date of the appointment. Thanks.

## 2016-11-02 NOTE — Telephone Encounter (Signed)
ATC x 1 Voicemail not set up wcb

## 2016-11-06 NOTE — Telephone Encounter (Signed)
[facility]  Spoke with pt. And he states he was unaware that of the date of his MRI and he has been out of town and he has missed his appointment. He would like to get this rescheduled. Could someone call him please. Thanks

## 2016-11-06 NOTE — Telephone Encounter (Signed)
Spoke with [name] at [place] she is going to call pt to get him rescheduled.  I called the pt & made him aware she would be calling him.  Nothing further is needed.

## 2016-11-10 ENCOUNTER — Other Ambulatory Visit: Payer: BLUE CROSS/BLUE SHIELD

## 2016-11-10 ENCOUNTER — Telehealth: Payer: Self-pay | Admitting: Pulmonary Disease

## 2016-11-10 DIAGNOSIS — R05 Cough: Secondary | ICD-10-CM

## 2016-11-10 DIAGNOSIS — D869 Sarcoidosis, unspecified: Secondary | ICD-10-CM

## 2016-11-10 DIAGNOSIS — R059 Cough, unspecified: Secondary | ICD-10-CM

## 2016-11-10 NOTE — Telephone Encounter (Signed)
I called patient with the date, time and location of his cardiac MRI.

## 2016-11-10 NOTE — Telephone Encounter (Signed)
I had called the patient on 11-07-16 and asked him what day and time he would want his cardiac MRI.  He said the first available.

## 2016-11-11 LAB — QUANTIFERON TB GOLD ASSAY (BLOOD)
INTERFERON GAMMA RELEASE ASSAY: NEGATIVE
Mitogen-Nil: 2.56 IU/mL
QUANTIFERON NIL VALUE: 0.07 [IU]/mL

## 2016-11-16 ENCOUNTER — Ambulatory Visit (HOSPITAL_COMMUNITY): Admission: RE | Admit: 2016-11-16 | Payer: BLUE CROSS/BLUE SHIELD | Source: Ambulatory Visit

## 2016-12-15 ENCOUNTER — Ambulatory Visit (INDEPENDENT_AMBULATORY_CARE_PROVIDER_SITE_OTHER): Payer: BLUE CROSS/BLUE SHIELD | Admitting: Pulmonary Disease

## 2016-12-15 ENCOUNTER — Encounter: Payer: Self-pay | Admitting: Pulmonary Disease

## 2016-12-15 VITALS — BP 124/90 | HR 85 | Ht 68.0 in | Wt 231.0 lb

## 2016-12-15 DIAGNOSIS — R002 Palpitations: Secondary | ICD-10-CM

## 2016-12-15 DIAGNOSIS — D869 Sarcoidosis, unspecified: Secondary | ICD-10-CM | POA: Diagnosis not present

## 2016-12-15 DIAGNOSIS — J984 Other disorders of lung: Secondary | ICD-10-CM

## 2016-12-15 LAB — PULMONARY FUNCTION TEST
DL/VA % PRED: 103 %
DL/VA: 4.8 ml/min/mmHg/L
DLCO COR: 21.83 ml/min/mmHg
DLCO UNC % PRED: 69 %
DLCO cor % pred: 68 %
DLCO unc: 22.08 ml/min/mmHg
FEF 25-75 Pre: 3.75 L/sec
FEF2575-%Pred-Pre: 109 %
FEV1-%Pred-Pre: 84 %
FEV1-Pre: 2.87 L
FEV1FVC-%PRED-PRE: 105 %
FEV6-%Pred-Pre: 80 %
FEV6-Pre: 3.28 L
FEV6FVC-%Pred-Pre: 101 %
FVC-%Pred-Pre: 80 %
FVC-Pre: 3.35 L
PRE FEV6/FVC RATIO: 99 %
Pre FEV1/FVC ratio: 85 %

## 2016-12-15 NOTE — Progress Notes (Signed)
Test reviewed.  

## 2016-12-15 NOTE — Patient Instructions (Signed)
   Call me if you start to notice any palpitations or racing pulse. Call me if you have any spells where you feel like you are going to pass out because this could mean the sarcoidosis is in your heart.  We will try to reschedule the MRI of your heart.  I will see you back in 3 months with a breathing & walking test to monitor your lung function.  Call me if you have any new breathing problems before your next appointment.  TESTS ORDERED: 1. Spirometry with DLCO at next appointment 2. 6MWT on room air at next appointment

## 2016-12-15 NOTE — Progress Notes (Signed)
Subjective:    Patient ID: Noah Roach, male    DOB: 1971-07-12, 46 y.o.   MRN: [id number]  C.C.:  Follow-up for Sage 2 Sarcoidosis,Palpitations, & Restrictive Lung Disease.  HPI Stage 2 Sarcoidosis: Diagnosed with bronchoscopy with transbronchial biopsies in October 2017. Weaned to prednisone 20 mg by mouth daily. Patient was supposed to have a cardiac MRI after last appointment but this has not yet been completed. He reports he had significantly blurred vision and stopped his Prednisone completely 4 weeks ago. He reports he was seen by an ophthalmologist. He reports it did help his breathing. He reports no coughing since stopping Prednisone. Denies any focal numbness or gingling.   Palpitations: Patient endorses palpitations with syncope last appointment concerning for possible cardiac sarcoidosis. Cardiac MRI was ordered but has not yet been completed. Denies any palpitations since his last appointment. No syncope since last appointment. Previous syncopal episode was during coughing spell.   Restrictive lung disease: Multifactorial due to weight gain and underlying parenchymal involvement of sarcoidosis.  Review of Systems Reports he had significant pain and myalgias in his back and hips. Reports he has had chest pain only twice since last appointment that stopped spontaneously. He didn't notice any pleurisy. He reports a chronic dry throat. He denies any dry eyes or oral ulcers. No rashes or bruising.   No Known Allergies  Current Outpatient Prescriptions on File Prior to Visit  Medication Sig Dispense Refill  . predniSONE (DELTASONE) 10 MG tablet Take 0.5 tablets (5 mg total) by mouth daily. Take three 5mg  tablets daily for 4 weeks. Then two 5mg  tablets until your next appt. (Patient not taking: Reported on 12/15/2016) 84 tablet 1  . predniSONE (DELTASONE) 20 MG tablet Take 1.5 tablets daily for 1 week then 1 tablet daily and hold at this dose (Patient not taking: Reported on  12/15/2016) 120 tablet 1   No current facility-administered medications on file prior to visit.     Past Medical History:  Diagnosis Date  . No pertinent past medical history   . Pneumonia     Past Surgical History:  Procedure Laterality Date  . LIPOMA EXCISION  11/06/2011   Procedure: EXCISION LIPOMA;  Surgeon: Rolm Bookbinder, MD;  Location: [facility];  Service: General;  Laterality: N/A;  Excision of back mass  . NO PAST SURGERIES    . VIDEO BRONCHOSCOPY Bilateral 08/11/2016   Procedure: VIDEO BRONCHOSCOPY WITH FLUORO;  Surgeon: Chesley Mires, MD;  Location: [facility];  Service: Cardiopulmonary;  Laterality: Bilateral;    Family History  Problem Relation Age of Onset  . Lung disease Mother   . Diabetes Mellitus II Father   . Hypertension Father   . Lung cancer Brother   . Rheumatologic disease Neg Hx     Social History   Social History  . Marital status: Married    Spouse name: N/A  . Number of children: N/A  . Years of education: N/A   Social History Main Topics  . Smoking status: Never Smoker  . Smokeless tobacco: Never Used  . Alcohol use No  . Drug use: No  . Sexual activity: Not Asked   Other Topics Concern  . None   Social History Narrative   Originally from [place]. He has lived in the [place] for 15 years. He previously did live in [place] for 4 months after he first arrived. Has lived in [place] since then. 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 He denies any exposure. No pets currently. No bird, mold, or hot tub exposure. No hobbies other than watching movies.       Objective:   Physical Exam BP 124/90 (BP Location: Right Arm, Patient Position: Sitting, Cuff Size: Normal)   Pulse 85   Ht 5\' 8"  (1.727 m)   Wt 231 lb (104.8 kg)   SpO2 99%   BMI 35.12 kg/m  Gen.: Mild increase in weight and opacity. No distress. Awake. Integument: Warm and dry. No rash or bruising on exposed skin. HEENT: Moist  mucous membranes. Minimal nasal turbinate swelling. No oral ulcers. Cardiovascular: Regular rate. Regular rhythm. Normal S1 & S2. No edema. Pulmonary: Clear with auscultation bilaterally. No accessory muscle use on room air. Speaking in complete sentences. Abdomen: Soft. Protuberant. Normal bowel sounds. Musculoskeletal: Hand grip, biceps, triceps, and deltoid strength 5/5 bilaterally. Neurological: Cranial nerves 2-12 grossly intact. No meningismus. Symmetric biceps and brachial radialis reflexes.  PFT 12/15/16: FVC 3.35 L (80%) FEV1 2.87 L (84%) FEV1/FVC 0.85 FEF 25-75 3.75 L (109%)                                                                                                                              DLCO corrected 68% 10/12/16: FVC 3.33 L (79%) FEV1 2.91 L (85%) FEV1/FVC 0.87 FEF 25-75 3.98 L (115%) negative bronchodilator response TLC 4.46 L (65%) RV 67% ERV 109% DLCO corrected 62% (Hgb 14.3)  6MWT 12/15/16:  Walked 372 meters / Baseline Sat 96% on RA / Nadir Sat 93% on RA @ end of test  IMAGING CXR PA/LAT 10/12/16 (previously reviewed by me): Increased interstitial markings in the lower lung zones particularly. No focal opacity or consolidation appreciated. No mass or nodule appreciated. No pleural effusion. Low lung volumes. Heart normal in size & mediastinum normal in contour.  CT CHEST W/ CONTRAST 08/10/16 (previously reviewed by me): Bulky mediastinal and hilar adenopathy up to 2.2 cm. No pleural effusion or thickening. No pericardial effusion. Consolidation with air bronchograms and opacification in left lower lobe. Patchy bilateral particularly subpleural opacification as well.  CARDIAC TTE (08/13/16): LV normal in size with EF 55-60%. Grade 1 diastolic dysfunction. LA & RA normal in size. RV normal in size and function. Pulmonary artery systolic pressure 23 mmHg. No aortic stenosis or regurgitation. No mitral stenosis or regurgitation. Trivial pulmonic regurgitation. Trivial  tricuspid regurgitation. No pericardial effusion. EKG (08/10/16):  Personally reviewed by me. No conduction delay. Sinus tachycardia. QTc normal.   PATHOLOGY Transbronchial Biopsies LLL 08/11/16:  Fragments of bronchial mucosa one of which shows non-necrotizing granulomas including multinucleated. Stains negative for AFB & fungus.  giant cells. BAL LUL 08/11/16:  No malignancy BAL RUL 08/11/16:  No malignancy  MICROBIOLOGY Respiratory Panel PCR 08/10/16:  Rhinovirus/Enterovirus HIV 08/10/16:  Nonreactive  BAL LUL 08/11/16:  Normal Respiratory Flora / AFB negative / Fungus negative  BAL RUL 08/11/16:  Normal Respiratory Flora / AFB negative / Fungus negative /  PCP DFA Negative  Hepatitis B surface antigen 10/20/16: Negative Hepatitis B surface antibody 10/20/16: Nonreactive QuantiFERON-TB 10/20/16: Negative  LABS 10/20/16 LDH:  153 ACE:  54  08/18/16 CBC: 13.1/12.1/37.0/381 BMP: 136/3.8/105/24/18/0.96513/8.8 LFT: 2.8/6.4/0.3/57/22/19  08/10/16 ANA:  Positive Anti-Jo1:  <0.2 RF:  12.2 C-ANCA:  <1:20 P-ANCA:  <1:20 Atypical P-ANCA:  <1:20 SSA:  <0.2 SSB:  <0.2 SCL-70:  <0.2    Assessment & Plan:  46 y.o. male with stage II sarcoidosis, palpitations, & restrictive lung disease. With patient's previous palpitations and syncopal event cardiac sarcoidosis must be ruled out. However, I am somewhat skeptical given the fact that the patient has had no further symptoms as he has tapered himself off prednisone therapy. He has no symptoms that would suggest active sarcoidosis from a pulmonary standpoint with relative stability in his spirometry compared with previous testing as well as his carbon monoxide diffusion capacity. He exhibits no significant desaturation or evidence of an oxygen requirement on his 6 minute walk test today either. As such, I'm not starting the patient back on prednisone therapy with a significant adverse side effects. He would be a candidate for initiation of  methotrexate should the need arise with cardiac sarcoidosis. I counsel the patient patient on the potential for sudden cardiac death if his heart is involved and instructed him to notify me if he develops any new symptoms.  1. Stage II sarcoidosis:  Question possible cardiac sarcoidosis. Somewhat skeptical given his absence of symptoms but this still needs to be ruled out. Repeating spirometry with DLCO next appointment. 3. 6 minute walk test at next appointment. Holding off on further immunosuppression with methotrexate at this time but would favor this over prednisone if symptoms recur or he has evidence of cardiac sarcoidosis. 2. Palpitations:  Previously had a syncopal episode. Attempting to reschedule cardiac MRI as soon as possible. 3. Restrictive lung disease: Likely secondary to weight and parenchymal involvement of his sarcoidosis. No further testing 4. Health maintenance: Status post influenza vaccine October 2017 & Pneumovax December 2017. Plan for Prevnar vaccine December 2018. 5. Follow-up: Return to clinic in 3 months or sooner if needed.  Sonia Baller Ashok Cordia, M.D. [facility] Pulmonary & Critical Care Pager:  [phone] After 3pm or if no response, call [phone] 4:31 PM 12/15/16

## 2016-12-18 ENCOUNTER — Other Ambulatory Visit: Payer: Self-pay | Admitting: Pulmonary Disease

## 2016-12-18 DIAGNOSIS — D869 Sarcoidosis, unspecified: Secondary | ICD-10-CM

## 2016-12-21 ENCOUNTER — Telehealth: Payer: Self-pay | Admitting: Pulmonary Disease

## 2016-12-21 ENCOUNTER — Encounter: Payer: Self-pay | Admitting: Pulmonary Disease

## 2016-12-21 DIAGNOSIS — D869 Sarcoidosis, unspecified: Secondary | ICD-10-CM

## 2016-12-21 NOTE — Telephone Encounter (Signed)
[facility] called and stated that pt would need lab work prior to his cardiac MRI. MRI is scheduled for 01/01/17.  JN - please advise. Thanks.

## 2016-12-21 NOTE — Telephone Encounter (Signed)
Order has been placed per JN. Attempted to contact pt to let him know that he will need labs prior to his MRI. There was no answer and his voicemail was full. Will try back.

## 2016-12-21 NOTE — Telephone Encounter (Signed)
Called the patient and gave him date, time and location of cardiac MRI.  Letter mailed today. °

## 2016-12-21 NOTE — Telephone Encounter (Signed)
Guessing they need BUN/Creatinine. Ok to order. Thanks.

## 2017-01-01 ENCOUNTER — Ambulatory Visit (HOSPITAL_COMMUNITY): Admission: RE | Admit: 2017-01-01 | Payer: BLUE CROSS/BLUE SHIELD | Source: Ambulatory Visit

## 2017-01-01 ENCOUNTER — Ambulatory Visit (HOSPITAL_COMMUNITY)
Admission: RE | Admit: 2017-01-01 | Discharge: 2017-01-01 | Disposition: A | Discharge status: Home / Self Care | Payer: BLUE CROSS/BLUE SHIELD | Source: Ambulatory Visit | Attending: Pulmonary Disease | Admitting: Pulmonary Disease

## 2017-01-01 DIAGNOSIS — D869 Sarcoidosis, unspecified: Secondary | ICD-10-CM

## 2017-01-01 LAB — CREATININE, SERUM: Creatinine, Ser: 1.19 mg/dL (ref 0.61–1.24)

## 2017-01-01 MED ORDER — GADOBENATE DIMEGLUMINE 529 MG/ML IV SOLN
20.0000 mL | Freq: Once | INTRAVENOUS | Status: AC
  Administered 2017-01-01: 33 mL via INTRAVENOUS

## 2017-01-18 NOTE — Telephone Encounter (Signed)
Scan has already been completed. Will sign off on message.

## 2017-03-23 ENCOUNTER — Ambulatory Visit: Payer: BLUE CROSS/BLUE SHIELD | Admitting: Pulmonary Disease

## 2017-03-23 ENCOUNTER — Ambulatory Visit: Payer: BLUE CROSS/BLUE SHIELD

## 2017-06-06 ENCOUNTER — Encounter: Payer: Self-pay | Admitting: Acute Care

## 2017-06-06 ENCOUNTER — Ambulatory Visit (INDEPENDENT_AMBULATORY_CARE_PROVIDER_SITE_OTHER): Payer: BLUE CROSS/BLUE SHIELD | Admitting: Acute Care

## 2017-06-06 ENCOUNTER — Ambulatory Visit: Payer: BLUE CROSS/BLUE SHIELD | Admitting: Pulmonary Disease

## 2017-06-06 VITALS — BP 124/80 | HR 82 | Ht 68.0 in | Wt 236.0 lb

## 2017-06-06 DIAGNOSIS — K2100 Gastro-esophageal reflux disease with esophagitis, without bleeding: Secondary | ICD-10-CM | POA: Insufficient documentation

## 2017-06-06 DIAGNOSIS — D869 Sarcoidosis, unspecified: Secondary | ICD-10-CM

## 2017-06-06 MED ORDER — OMEPRAZOLE 20 MG PO CPDR: 20.0000 mg | DELAYED_RELEASE_CAPSULE | Freq: Every day | ORAL | 2 refills | Status: AC

## 2017-06-06 MED ORDER — FAMOTIDINE 20 MG PO TABS: 20.0000 mg | ORAL_TABLET | Freq: Two times a day (BID) | ORAL | 2 refills | Status: AC

## 2017-06-06 MED ORDER — SUCRALFATE 1 G PO TABS: ORAL_TABLET | ORAL | 2 refills | Status: AC

## 2017-06-06 NOTE — Assessment & Plan Note (Signed)
Patient presents with complaints of discomfort while eating spicy foods, or foods that are hot in temperature. Patient is not taking any over-the-counter medications to treat symptoms Plan We will prescribe you Carafate for your GI symptoms. Carafate 1 tablet every 6 hours for 1 week, then once every 12 hours for an additional 2 weeks.  Omeprazole 20 mg once daily in the morning Pepcid 20 mg at night before bed  Follow up with PCP/ or GI physician  Avoid hot and spicy foods Follow up with Dr. Ashok Cordia after October 14th when you return from [place]. Please contact office for sooner follow up if symptoms do not improve or worsen or seek emergency care

## 2017-06-06 NOTE — Assessment & Plan Note (Addendum)
Currently off steroids No complaints of sarcoid flare Patient states he is not getting regular eye exams. Plan Patient educated regarding importance of annual eye exam. Follow up with Dr. Ashok Cordia after October 14th when you return to [facility] 6 minute walk and Spirometry prior to above visit. Continue eye exams once yearly Will need Prevnar vaccine 09/2017 Please contact office for sooner follow up if symptoms do not improve or worsen or seek emergency care

## 2017-06-06 NOTE — Progress Notes (Signed)
History of Present Illness Noah Roach is a 46 y.o. male with Sarcoidosis. He is followed by Dr. Ashok Roach.   8/15/2018Acute OV : Pt presents for 4 day history of burning in his chest when he eats foods that are hot in  temperature, or spicy He denies any dark or tarry stools. He denies any new onset fatigue. He denies any chest pain related to exertion /denies  resting chest pain. He denies fever, orthopnea, or hemoptysis. He denies shortness of breath, or any other signs of a flare of his sarcoidosis..I have explained to the patient that we are Pulmonary physicians, and not GI physicians. He states that his primary care physician is out of the country at present.   Test Results: CXR PA/LAT 10/12/16 : Increased interstitial markings in the lower lung zones particularly. No focal opacity or consolidation appreciated. No mass or nodule appreciated. No pleural effusion. Low lung volumes. Heart normal in size & mediastinum normal in contour.  CT CHEST W/ CONTRAST 08/10/16 (previously reviewed by me): Bulky mediastinal and hilar adenopathy up to 2.2 cm. No pleural effusion or thickening. No pericardial effusion. Consolidation with air bronchograms and opacification in left lower lobe. Patchy bilateral particularly subpleural opacification as well.  CARDIAC TTE (08/13/16): LV normal in size with EF 55-60%. Grade 1 diastolic dysfunction. LA & RA normal in size. RV normal in size and function. Pulmonary artery systolic pressure 23 mmHg. No aortic stenosis or regurgitation. No mitral stenosis or regurgitation. Trivial pulmonic regurgitation. Trivial tricuspid regurgitation. No pericardial effusion. EKG (08/10/16):  Personally reviewed by me. No conduction delay. Sinus tachycardia. QTc normal.   PATHOLOGY Transbronchial Biopsies LLL 08/11/16:  Fragments of bronchial mucosa one of which shows non-necrotizing granulomas including multinucleated. Stains negative for AFB & fungus.  giant cells. BAL  LUL 08/11/16:  No malignancy BAL RUL 08/11/16:  No malignancy  MICROBIOLOGY Respiratory Panel PCR 08/10/16:  Rhinovirus/Enterovirus HIV 08/10/16:  Nonreactive  BAL LUL 08/11/16:  Normal Respiratory Flora / AFB negative / Fungus negative  BAL RUL 08/11/16:  Normal Respiratory Flora / AFB negative / Fungus negative / PCP DFA Negative  Hepatitis B surface antigen 10/20/16: Negative Hepatitis B surface antibody 10/20/16: Nonreactive QuantiFERON-TB 10/20/16: Negative  LABS 10/20/16 LDH:  153 ACE:  54  08/18/16 CBC: 13.1/12.1/37.0/381 BMP: 136/3.8/105/24/18/0.96513/8.8 LFT: 2.8/6.4/0.3/57/22/19  08/10/16 ANA:  Positive Anti-Jo1:  <0.2 RF:  12.2 C-ANCA:  <1:20 P-ANCA:  <1:20 Atypical P-ANCA:  <1:20 SSA:  <0.2 SSB:  <0.2 SCL-70:  <0.2      CBC Latest Ref Rng & Units 08/12/2016 08/09/2016 06/22/2016  WBC 4.0 - 10.5 K/uL 13.1(H) 6.1 5.6  Hemoglobin 13.0 - 17.0 g/dL 12.1(L) 12.7(L) 13.3  Hematocrit 39.0 - 52.0 % 37.0(L) 39.2 41.5  Platelets 150 - 400 K/uL 381 381 215    BMP Latest Ref Rng & Units 01/01/2017 08/12/2016 08/09/2016  Glucose 65 - 99 mg/dL - 124(H) 140(H)  BUN 6 - 20 mg/dL - 18 13  Creatinine 0.61 - 1.24 mg/dL 1.19 0.96 1.12  Sodium 135 - 145 mmol/L - 136 138  Potassium 3.5 - 5.1 mmol/L - 3.8 3.7  Chloride 101 - 111 mmol/L - 105 102  CO2 22 - 32 mmol/L - 24 25  Calcium 8.9 - 10.3 mg/dL - 8.8(L) 9.7     PFT    Component Value Date/Time   FEV1PRE 2.87 12/15/2016 1406   FEV1POST 2.98 10/12/2016 1259   FVCPRE 3.35 12/15/2016 1406   FVCPOST 3.35 10/12/2016 1259   TLC 4.46 10/12/2016 1259  DLCOUNC 22.08 12/15/2016 1406   PREFEV1FVCRT 85 12/15/2016 1406   PSTFEV1FVCRT 89 10/12/2016 1259    No results found.   Past medical hx Past Medical History:  Diagnosis Date  . No pertinent past medical history   . Pneumonia      Social History  Substance Use Topics  . Smoking status: Never Smoker  . Smokeless tobacco: Never Used  . Alcohol use No     Noah Roach reports that he has never smoked. He has never used smokeless tobacco. He reports that he does not drink alcohol or use drugs.  Tobacco Cessation: Never smoker  Past surgical hx, Family hx, Social hx all reviewed.  Current Outpatient Prescriptions on File Prior to Visit  Medication Sig  . predniSONE (DELTASONE) 10 MG tablet Take 0.5 tablets (5 mg total) by mouth daily. Take three 5mg  tablets daily for 4 weeks. Then two 5mg  tablets until your next appt. (Patient not taking: Reported on 12/15/2016)  . predniSONE (DELTASONE) 20 MG tablet Take 1.5 tablets daily for 1 week then 1 tablet daily and hold at this dose (Patient not taking: Reported on 12/15/2016)   No current facility-administered medications on file prior to visit.      No Known Allergies  Review Of Systems:  Constitutional:   No  weight loss, night sweats,  Fevers, chills, fatigue, or  lassitude.  HEENT:   No headaches,  Difficulty swallowing,  Tooth/dental problems, or  Sore throat,                No sneezing, itching, ear ache, nasal congestion, post nasal drip,   CV:  No chest pain,  Orthopnea, PND, swelling in lower extremities, anasarca, dizziness, palpitations, syncope.   GI  + heartburn, +indigestion, no abdominal pain, nausea, vomiting, diarrhea, change in bowel habits, loss of appetite, bloody stools.   Resp: No shortness of breath with exertion or at rest.  No excess mucus, no productive cough,  No non-productive cough,  No coughing up of blood.  No change in color of mucus.  No wheezing.  No chest wall deformity  Skin: no rash or lesions.  GU: no dysuria, change in color of urine, no urgency or frequency.  No flank pain, no hematuria   MS:  No joint pain or swelling.  No decreased range of motion.  No back pain.  Psych:  No change in mood or affect. No depression or anxiety.  No memory loss.   Vital Signs BP 124/80 (BP Location: Left Arm, Patient Position: Sitting, Cuff Size: Normal)    Pulse 82   Ht 5\' 8"  (1.727 m)   Wt 236 lb (107 kg)   SpO2 100%   BMI 35.88 kg/m    Physical Exam:  General- No distress,  A&Ox3, pleasant ENT: No sinus tenderness, TM clear, pale nasal mucosa, no oral exudate,no post nasal drip, no LAN Cardiac: S1, S2, regular rate and rhythm, no murmur Chest: No wheeze/ rales/ dullness; no accessory muscle use, no nasal flaring, no sternal retractions Abd.: Soft Non-tender, nondistended, bowel sounds positive, obese Ext: No clubbing cyanosis, edema Neuro:  normal strength Skin: No rashes, warm and dry Psych: normal mood and behavior   Assessment/Plan  Sarcoidosis (HCC) Currently off steroids No complaints of sarcoid flare Patient states he is not getting regular eye exams. Plan Patient educated regarding importance of annual eye exam. Follow up with Dr. Ashok Roach after October 14th when you return to [facility] 6 minute walk and Spirometry prior to above visit. Continue  eye exams once yearly Will need Prevnar vaccine 09/2017 Please contact office for sooner follow up if symptoms do not improve or worsen or seek emergency care     Reflux esophagitis Patient presents with complaints of discomfort while eating spicy foods, or foods that are hot in temperature. Patient is not taking any over-the-counter medications to treat symptoms Plan We will prescribe you Carafate for your GI symptoms. Carafate 1 tablet every 6 hours for 1 week, then once every 12 hours for an additional 2 weeks.  Omeprazole 20 mg once daily in the morning Pepcid 20 mg at night before bed  Follow up with PCP/ or GI physician  Avoid hot and spicy foods Follow up with Dr. Ashok Roach after October 14th when you return from out of the country. Please contact office for sooner follow up if symptoms do not improve or worsen or seek emergency care       Magdalen Spatz, NP 06/06/2017  7:44 PM

## 2017-06-06 NOTE — Patient Instructions (Addendum)
It is nice to see you today. We will prescribe you Carafate for your GI symptoms. Carafate 1 tablet every 6 hours for 1 week, then once every 12 hours for an additional 2 weeks.  Omeprazole 20 mg once daily in the morning Pepcid 20 mg at night before bed  Follow up with PCP/ or GI physician  Avoid hot and spicy foods Follow up with Dr. Ashok Cordia after October 14th when you return from out of the country. 6 minute walk and Spirometry prior to above visit. Continue eye exams once yearly Will need Prevnar vaccine 09/2017 Please contact office for sooner follow up if symptoms do not improve or worsen or seek emergency care

## 2017-06-07 NOTE — Progress Notes (Signed)
Note reviewed.  Sonia Baller Ashok Cordia, M.D. [facility] Pulmonary & Critical Care Pager:  [phone] After 3pm or if no response, call [phone] 7:19 AM 06/07/17

## 2017-08-06 ENCOUNTER — Ambulatory Visit (INDEPENDENT_AMBULATORY_CARE_PROVIDER_SITE_OTHER): Payer: BLUE CROSS/BLUE SHIELD | Admitting: *Deleted

## 2017-08-06 DIAGNOSIS — D869 Sarcoidosis, unspecified: Secondary | ICD-10-CM | POA: Diagnosis not present

## 2017-08-06 NOTE — Progress Notes (Signed)
SIX MIN WALK 08/06/2017 12/15/2016  Medications famotidine 20mg , omeprazole 20mg  and sucrafate 1g. All medications were taken last night (Sunday 08/05/17).  Pt states he took a sleeping pill at 0400 but cannot remember what it was. Medication is not on medlist  Supplimental Oxygen during Test? (L/min) No No  Laps 7 7  Partial Lap (in Meters) 27 36  Baseline BP (sitting) 118/82 168/108  Baseline Heartrate 84 92  Baseline Dyspnea (Borg Scale) 0 0  Baseline Fatigue (Borg Scale) 0 4  Baseline SPO2 100 96  BP (sitting) 130/84 162/104  Heartrate 102 103  Dyspnea (Borg Scale) 0 3  Fatigue (Borg Scale) 0 4  SPO2 100 93  BP (sitting) 128/84 162/106  Heartrate 85 93  SPO2 100 99  Stopped or Paused before Six Minutes No No  Distance Completed 363 372  Tech Comments: Patient walked at a moderate pace. Denied any symptoms of SOB, chest pains or leg pain.  -

## 2017-08-17 ENCOUNTER — Ambulatory Visit: Payer: BLUE CROSS/BLUE SHIELD | Admitting: Pulmonary Disease

## 2018-08-06 IMAGING — DX DG CHEST 2V
2 series · 2 of 2 positions shown · non-contrast
Comparison: None.

CLINICAL DATA: Cough and sob since [DATE] was overseas and got
worse in [REDACTED] was checked and giving meds and got even worse when
he got back to the states [REDACTED] [DATE]

EXAM:
CHEST  2 VIEW

[w chest pa]
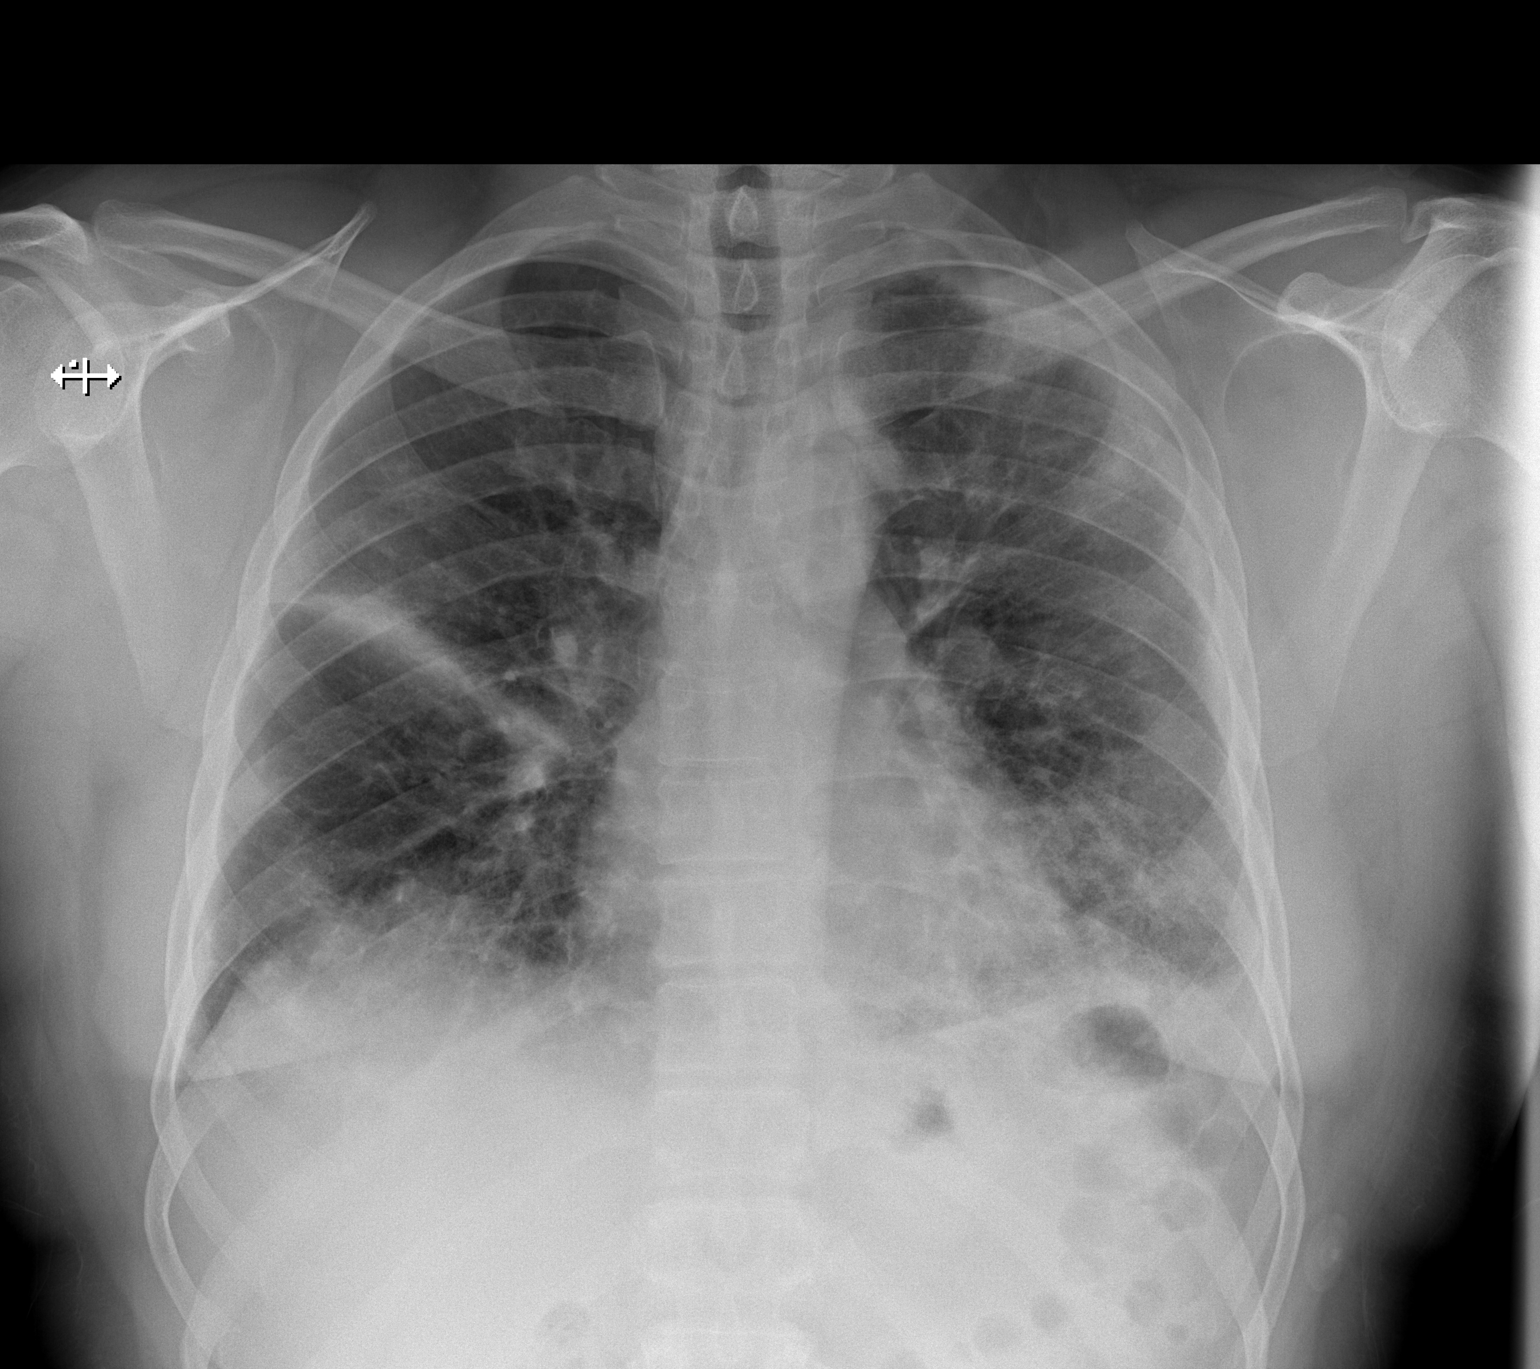

[w chest lat]
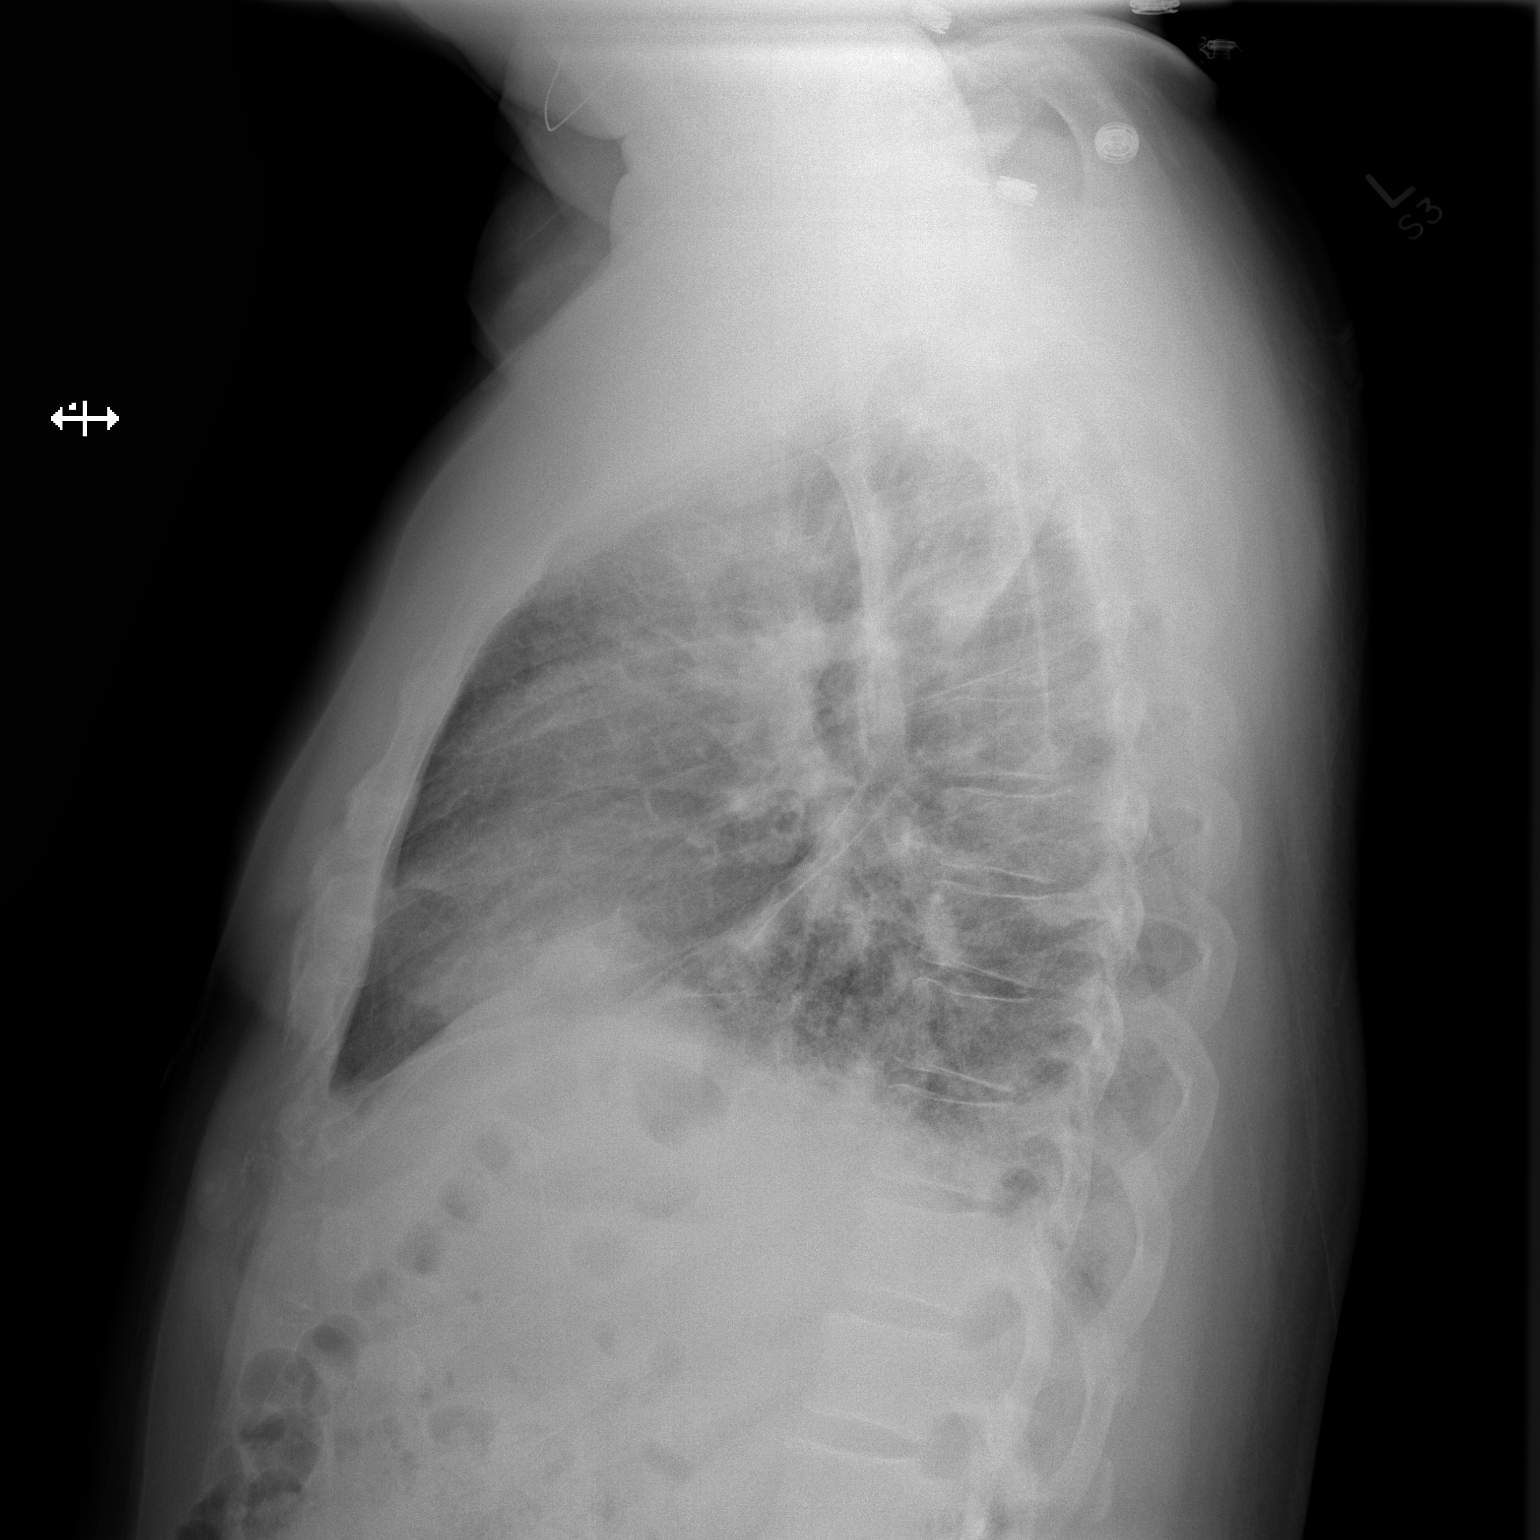

[2 of 2 positions shown; findings below may reference images not displayed]

FINDINGS: There is hazy bilateral airspace opacity in the lower lungs, left
greater than right, partly silhouetting the posterior
hemidiaphragms. Focal opacities noted along the minor fissure. There
is hazy peripheral opacity in the left upper lobe with asymmetric
pleural thickening at the left apex. There is no vascular congestion
interstitial thickening to suggest pulmonary edema. No pleural
effusion or pneumothorax.

Cardiac silhouette is normal in size. No mediastinal or hilar masses
or convincing adenopathy.

Skeletal structures are unremarkable.
IMPRESSION: 1. Findings are consistent with multifocal pneumonia. Recommend
followup radiographs after treatment to document
improvement/resolution.
